# Patient Record
Sex: Male | Born: 1955 | Race: White | Hispanic: No | Marital: Married | Smoking: Never smoker
Health system: Southern US, Community
[De-identification: ages and names within clinical notes are randomized; demographics above are authoritative.]

## PROBLEM LIST (undated history)

## (undated) DIAGNOSIS — K219 Gastro-esophageal reflux disease without esophagitis: Secondary | ICD-10-CM

## (undated) DIAGNOSIS — K56609 Unspecified intestinal obstruction, unspecified as to partial versus complete obstruction: Secondary | ICD-10-CM

## (undated) DIAGNOSIS — I10 Essential (primary) hypertension: Secondary | ICD-10-CM

## (undated) DIAGNOSIS — E785 Hyperlipidemia, unspecified: Secondary | ICD-10-CM

## (undated) HISTORY — PX: CATARACT EXTRACTION: SUR2

## (undated) HISTORY — DX: Hyperlipidemia, unspecified: E78.5

---

## 2005-11-09 ENCOUNTER — Ambulatory Visit (HOSPITAL_COMMUNITY): Admission: RE | Admit: 2005-11-09 | Discharge: 2005-11-09 | Payer: Self-pay | Admitting: Internal Medicine

## 2008-02-20 ENCOUNTER — Ambulatory Visit (HOSPITAL_COMMUNITY): Admission: RE | Admit: 2008-02-20 | Discharge: 2008-02-20 | Payer: Self-pay | Admitting: Pulmonary Disease

## 2009-09-25 ENCOUNTER — Inpatient Hospital Stay (HOSPITAL_COMMUNITY): Admission: EM | Admit: 2009-09-25 | Discharge: 2009-09-29 | Payer: Self-pay | Admitting: Emergency Medicine

## 2009-10-02 ENCOUNTER — Ambulatory Visit: Payer: Self-pay | Admitting: Gastroenterology

## 2009-10-02 DIAGNOSIS — R109 Unspecified abdominal pain: Secondary | ICD-10-CM | POA: Insufficient documentation

## 2009-10-02 DIAGNOSIS — R1084 Generalized abdominal pain: Secondary | ICD-10-CM | POA: Insufficient documentation

## 2009-10-04 ENCOUNTER — Ambulatory Visit (HOSPITAL_COMMUNITY): Admission: RE | Admit: 2009-10-04 | Discharge: 2009-10-04 | Payer: Self-pay | Admitting: Gastroenterology

## 2009-11-27 ENCOUNTER — Encounter (INDEPENDENT_AMBULATORY_CARE_PROVIDER_SITE_OTHER): Payer: Self-pay | Admitting: *Deleted

## 2010-02-25 NOTE — Letter (Signed)
Summary: UGI WITH SBFT ORDER  UGI WITH SBFT ORDER   Imported By: Ave Filter 10/02/2009 14:39:18  _____________________________________________________________________  External Attachment:    Type:   Image     Comment:   External Document

## 2010-02-25 NOTE — Assessment & Plan Note (Signed)
Summary: ABD PAIN 2O TO pSBO   Visit Type:  Initial Consult Referring Vicenta Olds:  [facility] Orton Capell:  Ouida Sills, M.D.  Chief Complaint:  abd pain/bloating.  History of Present Illness: BCs powders: for HAs once a week for  ~40 years. Still taking them. Sx for a couple of years with chest hurting and bloating. Eats something slow about going down. Food builds up in stomach.  WED hurting bad, NV and taken to ED. Hospitalized in SEP 2011. Saw Dr. Malvin Johns who said "intestines were twisted and might take card of itself." Now watching what he he's eating. BMs: 1-2 times a day, nl BM. Can be loose. No blood in stool. Soon as drinks a drink feels stopped up. Sx lasts for about 3 hours. Pain begins w/i in a hour. No problems swallowing.   No weight loss. Appetite: if could eat more he would. Reflux helps GERD. Can have breakthrough Sx-no cigs.  Taking Motrin rare for Has.  Preventive Screening-Counseling & Management  Alcohol-Tobacco     Smoking Status: never  Current Medications (verified): 1)  Toprol Xl 100 Mg Xr24h-Tab (Metoprolol Succinate) .... Take 1 Tablet By Mouth Once A Day 2)  Amlodipine Besylate 10 Mg Tabs (Amlodipine Besylate) .... Take 1 Tablet By Mouth Once A Day 3)  Protonix 40 Mg Tbec (Pantoprazole Sodium) .... Take 1 Tablet By Mouth Once A Day 4)  Bc Powders .... As Needed 5)  Motrin Ib 200 Mg Tabs (Ibuprofen) .... 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  Alcohol Use - no Married: x 10 yrs-4 own, 3 step Smoking Status:  never  Review of Systems       No melena for couple years.  PER HPI OTHERWISE ALL SYSTEMS NEGATIVE.  Vital Signs:  Patient  profile:   55 year old male Height:      65 inches Weight:      257 pounds BMI:     42.92 Temp:     98.5 degrees F oral Pulse rate:   56 / minute BP sitting:   150 / 92  (left arm) Cuff size:   large  Vitals Entered By: Cloria Spring LPN (October 02, 2009 1:49 PM)  Physical Exam  General:  Well developed, well nourished, no acute distress. Head:  Normocephalic and atraumatic. Eyes:  PERRL, no icterus. Mouth:  No deformity or lesions. Neck:  Supple; no masses. Lungs:  Clear throughout to auscultation. Heart:  Regular rate and rhythm; no murmurs. Abdomen:  Soft, MILD TTP LUQ and MOD TTP IN LLQ W/ITH MILD REBOUND AND NO GUARDING, mildly distended. No masses noted. Normal bowel sounds. obese Extremities:  No edema noted. Neurologic:  Alert and  oriented x4;  grossly normal neurologically.  Impression & Recommendations:  Problem # 1:  ABDOMINAL PAIN OTHER SPECIFIED SITE (ICD-789.09) Assessment New Likley 2o to adhesion in small bowell or NSAID ENTEROPATHY (STRICTURE). Pt has Sx daily. UGI/SBFT then refer to Dr. Malvin Johns. OPV in 6 mos and will discuss TCS. Low residue diet HO given.  CC: PCP  Appended Document: ABD PAIN 2O TO pSBO 6 MONTH F/U OPV IS IN THE COMPUTER  Appended Document: ABD PAIN 2O TO pSBO 6 MONTH F/U OPV IS IN THE  COMPUTER  Appended Document: Orders Update    Clinical Lists Changes  Orders: Added new Service order of Consultation Level IV [phone]) - Signed

## 2010-02-25 NOTE — Letter (Signed)
Summary: Recall Office Visit  [facility]  [address]   [place], [place] [address]   Phone: [phone]  Fax: [phone]      November 27, 2009   DARREL GLOSS [address] [place], [place]  [address] 1955-06-13   Dear Mr. DULUDE,   According to our records, it is time for you to schedule a follow-up office visit with [place].   At your convenience, please call [phone] to schedule an office visit. If you have any questions, concerns, or feel that this letter is in error, we would appreciate your call.   Sincerely,    Diana Eves  [facility] Ph: [phone]   Fax: [phone]

## 2010-04-10 LAB — DIFFERENTIAL
Basophils Absolute: 0 10*3/uL (ref 0.0–0.1)
Basophils Absolute: 0 10*3/uL (ref 0.0–0.1)
Basophils Relative: 1 % (ref 0–1)
Eosinophils Relative: 3 % (ref 0–5)
Lymphocytes Relative: 19 % (ref 12–46)
Lymphocytes Relative: 25 % (ref 12–46)
Lymphs Abs: 1.5 10*3/uL (ref 0.7–4.0)
Monocytes Absolute: 0.9 10*3/uL (ref 0.1–1.0)
Monocytes Absolute: 1.2 10*3/uL — ABNORMAL HIGH (ref 0.1–1.0)
Monocytes Absolute: 1.5 10*3/uL — ABNORMAL HIGH (ref 0.1–1.0)
Neutro Abs: 3 10*3/uL (ref 1.7–7.7)
Neutro Abs: 3.3 10*3/uL (ref 1.7–7.7)
Neutro Abs: 5.2 10*3/uL (ref 1.7–7.7)
Neutrophils Relative %: 51 % (ref 43–77)
Neutrophils Relative %: 54 % (ref 43–77)

## 2010-04-10 LAB — URINE CULTURE
Colony Count: NO GROWTH
Culture: NO GROWTH

## 2010-04-10 LAB — CBC
HCT: 43.1 % (ref 39.0–52.0)
HCT: 44.7 % (ref 39.0–52.0)
Hemoglobin: 14.9 g/dL (ref 13.0–17.0)
MCHC: 34.6 g/dL (ref 30.0–36.0)
MCV: 91.1 fL (ref 78.0–100.0)
Platelets: 187 10*3/uL (ref 150–400)
RBC: 4.73 MIL/uL (ref 4.22–5.81)
RDW: 12.3 % (ref 11.5–15.5)
RDW: 12.8 % (ref 11.5–15.5)
WBC: 6.1 10*3/uL (ref 4.0–10.5)
WBC: 8 10*3/uL (ref 4.0–10.5)

## 2010-04-10 LAB — BASIC METABOLIC PANEL
BUN: 12 mg/dL (ref 6–23)
BUN: 8 mg/dL (ref 6–23)
Calcium: 8.6 mg/dL (ref 8.4–10.5)
Calcium: 9 mg/dL (ref 8.4–10.5)
Chloride: 102 mEq/L (ref 96–112)
GFR calc Af Amer: >60 mL/min (ref >60)
GFR calc non Af Amer: >60 mL/min (ref >60)
GFR calc non Af Amer: >60 mL/min (ref >60)
GFR calc non Af Amer: >60 mL/min (ref >60)
Glucose, Bld: 110 mg/dL — ABNORMAL HIGH (ref 70–99)
Potassium: 3.3 mEq/L — ABNORMAL LOW (ref 3.5–5.1)
Potassium: 3.3 mEq/L — ABNORMAL LOW (ref 3.5–5.1)
Sodium: 136 mEq/L (ref 135–145)

## 2010-04-11 LAB — COMPREHENSIVE METABOLIC PANEL
ALT: 49 U/L (ref 0–53)
AST: 49 U/L — ABNORMAL HIGH (ref 0–37)
Alkaline Phosphatase: 102 U/L (ref 39–117)
CO2: 26 mEq/L (ref 19–32)
Chloride: 104 mEq/L (ref 96–112)
Creatinine, Ser: 1.19 mg/dL (ref 0.4–1.5)
GFR calc Af Amer: >60 mL/min (ref >60)
GFR calc non Af Amer: >60 mL/min (ref >60)
Sodium: 139 mEq/L (ref 135–145)
Total Bilirubin: 1 mg/dL (ref 0.3–1.2)

## 2010-04-11 LAB — DIFFERENTIAL
Basophils Absolute: 0 10*3/uL (ref 0.0–0.1)
Basophils Relative: 0 % (ref 0–1)
Eosinophils Absolute: 0 10*3/uL (ref 0.0–0.7)
Eosinophils Relative: 0 % (ref 0–5)

## 2010-04-11 LAB — URINALYSIS, ROUTINE W REFLEX MICROSCOPIC
Bilirubin Urine: NEGATIVE
Glucose, UA: NEGATIVE mg/dL
Ketones, ur: NEGATIVE mg/dL
Leukocytes, UA: NEGATIVE
Nitrite: NEGATIVE
Specific Gravity, Urine: >1.03 — ABNORMAL HIGH (ref 1.005–1.030)
pH: 6 (ref 5.0–8.0)

## 2010-04-11 LAB — CBC
Hemoglobin: 16.8 g/dL (ref 13.0–17.0)
MCH: 31.3 pg (ref 26.0–34.0)
RBC: 5.35 MIL/uL (ref 4.22–5.81)

## 2010-04-11 LAB — URINE MICROSCOPIC-ADD ON

## 2010-04-11 LAB — LIPASE, BLOOD: Lipase: 28 U/L (ref 11–59)

## 2011-06-06 ENCOUNTER — Encounter (HOSPITAL_COMMUNITY): Payer: Self-pay

## 2011-06-06 ENCOUNTER — Inpatient Hospital Stay (HOSPITAL_COMMUNITY)
Admission: EM | Admit: 2011-06-06 | Discharge: 2011-06-09 | DRG: 388 | Disposition: A | Discharge status: Home / Self Care | Payer: 59 | Attending: Internal Medicine | Admitting: Internal Medicine

## 2011-06-06 ENCOUNTER — Emergency Department (HOSPITAL_COMMUNITY): Payer: 59

## 2011-06-06 DIAGNOSIS — E119 Type 2 diabetes mellitus without complications: Secondary | ICD-10-CM | POA: Diagnosis present

## 2011-06-06 DIAGNOSIS — K565 Intestinal adhesions [bands], unspecified as to partial versus complete obstruction: Secondary | ICD-10-CM

## 2011-06-06 DIAGNOSIS — E1165 Type 2 diabetes mellitus with hyperglycemia: Secondary | ICD-10-CM

## 2011-06-06 DIAGNOSIS — E860 Dehydration: Secondary | ICD-10-CM | POA: Diagnosis present

## 2011-06-06 DIAGNOSIS — K56609 Unspecified intestinal obstruction, unspecified as to partial versus complete obstruction: Principal | ICD-10-CM | POA: Diagnosis present

## 2011-06-06 DIAGNOSIS — E669 Obesity, unspecified: Secondary | ICD-10-CM | POA: Diagnosis present

## 2011-06-06 DIAGNOSIS — I10 Essential (primary) hypertension: Secondary | ICD-10-CM | POA: Diagnosis present

## 2011-06-06 DIAGNOSIS — J690 Pneumonitis due to inhalation of food and vomit: Secondary | ICD-10-CM | POA: Diagnosis present

## 2011-06-06 DIAGNOSIS — N179 Acute kidney failure, unspecified: Secondary | ICD-10-CM | POA: Diagnosis present

## 2011-06-06 DIAGNOSIS — D696 Thrombocytopenia, unspecified: Secondary | ICD-10-CM

## 2011-06-06 HISTORY — DX: Unspecified intestinal obstruction, unspecified as to partial versus complete obstruction: K56.609

## 2011-06-06 HISTORY — DX: Gastro-esophageal reflux disease without esophagitis: K21.9

## 2011-06-06 HISTORY — DX: Essential (primary) hypertension: I10

## 2011-06-06 LAB — CBC
HCT: 51 % (ref 39.0–52.0)
Hemoglobin: 18.2 g/dL — ABNORMAL HIGH (ref 13.0–17.0)
MCH: 31.8 pg (ref 26.0–34.0)
MCHC: 35.7 g/dL (ref 30.0–36.0)

## 2011-06-06 LAB — COMPREHENSIVE METABOLIC PANEL
ALT: 39 U/L (ref 0–53)
AST: 32 U/L (ref 0–37)
Albumin: 4.2 g/dL (ref 3.5–5.2)
CO2: 28 mEq/L (ref 19–32)
Calcium: 10.3 mg/dL (ref 8.4–10.5)
GFR calc non Af Amer: 40 mL/min — ABNORMAL LOW (ref >90)
Sodium: 135 mEq/L (ref 135–145)

## 2011-06-06 LAB — DIFFERENTIAL
Band Neutrophils: 0 % (ref 0–10)
Basophils Absolute: 0 10*3/uL (ref 0.0–0.1)
Basophils Relative: 0 % (ref 0–1)
Eosinophils Absolute: 0 10*3/uL (ref 0.0–0.7)
Eosinophils Relative: 0 % (ref 0–5)
Lymphocytes Relative: 24 % (ref 12–46)
Lymphs Abs: 3 10*3/uL (ref 0.7–4.0)
Myelocytes: 0 %
Promyelocytes Absolute: 0 %

## 2011-06-06 MED ORDER — ONDANSETRON HCL 4 MG/2ML IJ SOLN
4.0000 mg | Freq: Once | INTRAMUSCULAR | Status: AC
  Administered 2011-06-06: 4 mg via INTRAVENOUS
  Filled 2011-06-06: qty 2

## 2011-06-06 MED ORDER — SODIUM CHLORIDE 0.9 % IV BOLUS (SEPSIS)
1000.0000 mL | Freq: Once | INTRAVENOUS | Status: AC
  Administered 2011-06-06: 1000 mL via INTRAVENOUS

## 2011-06-06 MED ORDER — IOHEXOL 300 MG/ML  SOLN
100.0000 mL | Freq: Once | INTRAMUSCULAR | Status: AC | PRN
  Administered 2011-06-06: 100 mL via INTRAVENOUS

## 2011-06-06 MED ORDER — ONDANSETRON HCL 4 MG/2ML IJ SOLN: 4.0000 mg | Freq: Once | INTRAMUSCULAR | Status: DC

## 2011-06-06 MED ORDER — HYDROMORPHONE HCL PF 1 MG/ML IJ SOLN
1.0000 mg | Freq: Once | INTRAMUSCULAR | Status: AC
  Administered 2011-06-06: 1 mg via INTRAVENOUS
  Filled 2011-06-06: qty 1

## 2011-06-06 NOTE — H&P (Signed)
Benjamin Moreno is an 56 y.o. male.    PCP: Carylon Perches, MD, MD   Chief Complaint: Abdominal pain, nausea, vomiting, and diarrhea  HPI: This is a 56 year old, Caucasian male, with a past medical history of hypertension, and diabetes, who was in his usual state of health till Thursday, when he started having nausea, vomiting, and diarrhea. He tells me that he has vomited about 25 times. Initially, it was food and then watery. Denies any blood in the emesis. He's been having several episodes of loose stool. Denies any recent antibiotic use. His abdominal pain is located in the lower abdomen. It's a pressure-like sensation. 10 out of 10 in intensity. Denies any radiation. Denies any precipitating, aggravating or relieving factors. He said he felt quite hot and thought he might have had a fever yesterday, but did not check his temperature. Denies any sick contacts. He had similar episode about 2 years ago.   Home Medications: Prior to Admission medications   Medication Sig Start Date End Date Taking? Authorizing Provider  amLODipine (NORVASC) 10 MG tablet Take 10 mg by mouth daily.   Yes Historical Provider, MD  glimepiride (AMARYL) 1 MG tablet Take 1 mg by mouth daily.   Yes Historical Provider, MD  metFORMIN (GLUCOPHAGE) 500 MG tablet Take 500 mg by mouth 2 (two) times daily.   Yes Historical Provider, MD  metoprolol succinate (TOPROL-XL) 100 MG 24 hr tablet Take 100 mg by mouth daily. Take with or immediately following a meal.   Yes Historical Provider, MD  pantoprazole (PROTONIX) 40 MG tablet Take 40 mg by mouth daily.   Yes Historical Provider, MD    Allergies: No Known Allergies  Past Medical History: Past Medical History  Diagnosis Date  . Bowel obstruction   . Hypertension   . Diabetes mellitus     Past Surgical History  Procedure Date  . Cataract extraction     Social History:  reports that he has never smoked. He does not have any smokeless tobacco history on file. He reports  that he does not drink alcohol or use illicit drugs.  Family History:  Family History  Problem Relation Age of Onset  . Cancer Mother   . Heart disease Father     Review of Systems - History obtained from the patient General ROS: positive for  - fatigue Psychological ROS: negative Ophthalmic ROS: negative ENT ROS: negative Allergy and Immunology ROS: negative Hematological and Lymphatic ROS: negative Endocrine ROS: negative Respiratory ROS: no cough, shortness of breath, or wheezing Cardiovascular ROS: no chest pain or dyspnea on exertion Gastrointestinal ROS: as in hpi Genito-Urinary ROS: negative Musculoskeletal ROS: negative Neurological ROS: no TIA or stroke symptoms Dermatological ROS: negative  Physical Examination Blood pressure 132/79, pulse 88, temperature 98.5 F (36.9 C), temperature source Oral, resp. rate 18, height 5\' 4"  (1.626 m), weight 108.863 kg (240 lb), SpO2 98.00%.  General appearance: alert, cooperative, appears stated age, no distress and moderately obese Head: Normocephalic, without obvious abnormality, atraumatic Eyes: conjunctivae/corneas clear. PERRL, EOM's intact.  Throat: lips, mucosa, and tongue normal; teeth and gums normal Neck: no adenopathy, no carotid bruit, no JVD, supple, symmetrical, trachea midline and thyroid not enlarged, symmetric, no tenderness/mass/nodules Back: symmetric, no curvature. ROM normal. No CVA tenderness. Resp: clear to auscultation bilaterally Cardio: regular rate and rhythm, S1, S2 normal, no murmur, click, rub or gallop GI: Obese. Tender in the lower abdomen, but more so on the left lower quadrant. No rebound, rigidity, or guarding. No masses,  or organomegaly speciated. Also present. Extremities: extremities normal, atraumatic, no cyanosis or edema Pulses: 2+ and symmetric Skin: Skin color, texture, turgor normal. No rashes or lesions Lymph nodes: Cervical, supraclavicular, and axillary nodes normal. Neurologic:  Alert and oriented X 3, normal strength and tone. Normal symmetric reflexes. Normal coordination and gait  Laboratory Data: Results for orders placed during the hospital encounter of 06/06/11 (from the past 48 hour(s))  CBC     Status: Abnormal   Collection Time   06/06/11  9:03 PM      Component Value Range Comment   WBC 12.5 (*) 4.0 - 10.5 (K/uL)    RBC 5.73  4.22 - 5.81 (MIL/uL)    Hemoglobin 18.2 (*) 13.0 - 17.0 (g/dL)    HCT 16.1  09.6 - 04.5 (%)    MCV 89.0  78.0 - 100.0 (fL)    MCH 31.8  26.0 - 34.0 (pg)    MCHC 35.7  30.0 - 36.0 (g/dL)    RDW 40.9  81.1 - 91.4 (%)    Platelets 246  150 - 400 (K/uL)   DIFFERENTIAL     Status: Abnormal   Collection Time   06/06/11  9:03 PM      Component Value Range Comment   Neutrophils Relative 72  43 - 77 (%)    Lymphocytes Relative 24  12 - 46 (%)    Monocytes Relative 4  3 - 12 (%)    Eosinophils Relative 0  0 - 5 (%)    Basophils Relative 0  0 - 1 (%)    Band Neutrophils 0  0 - 10 (%)    Metamyelocytes Relative 0      Myelocytes 0      Promyelocytes Absolute 0      Blasts 0      nRBC 0  0 (/100 WBC)    Neutro Abs 9.0 (*) 1.7 - 7.7 (K/uL)    Lymphs Abs 3.0  0.7 - 4.0 (K/uL)    Monocytes Absolute 0.5  0.1 - 1.0 (K/uL)    Eosinophils Absolute 0.0  0.0 - 0.7 (K/uL)    Basophils Absolute 0.0  0.0 - 0.1 (K/uL)   COMPREHENSIVE METABOLIC PANEL     Status: Abnormal   Collection Time   06/06/11  9:03 PM      Component Value Range Comment   Sodium 135  135 - 145 (mEq/L)    Potassium 4.0  3.5 - 5.1 (mEq/L)    Chloride 95 (*) 96 - 112 (mEq/L)    CO2 28  19 - 32 (mEq/L)    Glucose, Bld 116 (*) 70 - 99 (mg/dL)    BUN 37 (*) 6 - 23 (mg/dL)    Creatinine, Ser 7.82 (*) 0.50 - 1.35 (mg/dL)    Calcium 95.6  8.4 - 10.5 (mg/dL)    Total Protein 8.3  6.0 - 8.3 (g/dL)    Albumin 4.2  3.5 - 5.2 (g/dL)    AST 32  0 - 37 (U/L)    ALT 39  0 - 53 (U/L)    Alkaline Phosphatase 78  39 - 117 (U/L)    Total Bilirubin 0.9  0.3 - 1.2 (mg/dL)    GFR calc  non Af Amer 40 (*) >90 (mL/min)    GFR calc Af Amer 47 (*) >90 (mL/min)   LIPASE, BLOOD     Status: Normal   Collection Time   06/06/11  9:03 PM      Component Value Range  Comment   Lipase 36  11 - 59 (U/L)     Radiology Reports: Ct Abdomen Pelvis W Contrast  06/06/2011  *RADIOLOGY REPORT*  Clinical Data: Abdominal pain, nausea and vomiting.  CT ABDOMEN AND PELVIS WITH CONTRAST  Technique:  Multidetector CT imaging of the abdomen and pelvis was performed following the standard protocol during bolus administration of intravenous contrast.  Contrast: OMNIPAQUE IOHEXOL 300 MG/ML  SOLN  Comparison: Plain films of the chest and abdomen earlier this same date.  CT abdomen and pelvis 09/27/2009 and 09/25/2009.  Findings: There is patchy airspace disease in the right middle lobe with some air bronchograms present.  Punctate sub centimeter pleural nodule in the right lower lobe on image 5 is unchanged. No pleural or pericardial effusion.  The lung bases are otherwise unremarkable.  As seen on plain films, the patient has a small bowel obstruction. Small bowel loops are dilated up to 4.4 cm.  There is no pneumatosis, free intraperitoneal air or portal venous gas.  No mass is identified.  Transition point appears to lie in the mid abdomen where an angulated loop is identified on image 55.  Distal loops are decompressed.  The colon is unremarkable.  The patient is status post appendectomy.  The gallbladder, liver, spleen, adrenal glands and pancreas are unremarkable.  There is some scarring in the left kidney.  Punctate nonobstructing stone is noted in the upper pole of the right kidney.  Small fat containing umbilical hernia is identified. There is no focal bony abnormality.  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  IMPRESSION:  1.  Study is positive for small bowel obstruction which appears to be due to adhesions. 2.  Right middle lobe airspace disease is worrisome for pneumonia. 3.   Punctate nonobstructing stone upper pole right kidney.  Original Report Authenticated By: Bernadene Bell. D'ALESSIO, M.D.   Dg Abd Acute W/chest  06/06/2011  *RADIOLOGY REPORT*  Clinical Data: Abdominal pain and distention.  Nausea and vomiting.  ACUTE ABDOMEN SERIES (ABDOMEN 2 VIEW & CHEST 1 VIEW)  Comparison: Chest and two views abdomen 09/28/2009.  Findings: Single view of the chest demonstrates clear lungs and normal heart size.  No pneumothorax or pleural fluid.  Two views of the abdomen show no free intraperitoneal air. Multiple dilated loops of small bowel measuring up to 5.7 cm are seen. Small bowel air-fluid levels are noted.  There is some gas in the colon.  IMPRESSION: Findings compatible with small bowel obstruction.  Original Report Authenticated By: Bernadene Bell. Maricela Curet, M.D.     Assessment/Plan  Principal Problem:  *SBO (small bowel obstruction) Active Problems:  HTN (hypertension)  DM type 2 (diabetes mellitus, type 2)  Obesity  ARF (acute renal failure)  Aspiration pneumonia   #1 small bowel obstruction thought to be secondary to adhesions: He reports no abdominal surgeries in the past. NG tube will be placed. Patient will be kept n.p.o. Acute abdominal series will be repeated in the morning. General surgical consultation will be obtained.  #2 pneumonia: This is most likely due to aspiration. We will cover him with Levaquin for now and monitor him closely.  #3. Mild acute renal failure: Likely due to hypovolemia. We will give him IV fluids, and recheck his renal function.  #4 diabetes, type II: We will put him on a sliding scale insulin.  #5 history of hypertension: Monitor blood pressures closely.  DVT, prophylaxis will be initiated.  Further management decisions will depend on results  of further testing and patient's response to treatment.  Dr. Ouida Sills will assume care in the morning.  [facility]  [facility] Pager [phone]  06/06/2011, 11:32 PM

## 2011-06-06 NOTE — ED Notes (Signed)
Pt presents with abdominal pain, diarrhea, and vomiting since Thursday. Pt states he has a bowel obstruction.

## 2011-06-06 NOTE — ED Provider Notes (Signed)
History  This chart was scribed for Donnetta Hutching, MD by Bennett Scrape. This patient was seen in room APA06/APA06 and the patient's care was started at 8:24PM.  CSN: [id number]  Arrival date & time 06/06/11  1951   First MD Initiated Contact with Patient 06/06/11 2024      Chief Complaint  Patient presents with  . Diarrhea  . Emesis    The history is provided by the patient. No language interpreter was used.     Benjamin Moreno is a 56 y.o. male who presents to the Emergency Department complaining of 2 days of diffuse abdominal pain with associated diarrhea described as just water and emesis. Pt reports that laying flat improves his pain. He also c/o decreased urine output and sternal chest pain that he attributes to the several episodes of emesis. He has not taken any medications to improve his pain. He reports that he had a bowel obstruction 2 years ago and states that the pain is similar to the pain experienced then. He denies having abdominal surgery to correct the obstruction. He denies any other symptoms such as fever, rash, back pain, dysuria and HA. He has also has a h/o HTN and diabetes. He denies smoking and alcohol use.  PCP is Dr. Ouida Sills.   Past Medical History  Diagnosis Date  . Bowel obstruction   . Hypertension   . Diabetes mellitus     History reviewed. No pertinent past surgical history.  No family history on file.  History  Substance Use Topics  . Smoking status: Never Smoker   . Smokeless tobacco: Not on file  . Alcohol Use: No      Review of Systems  A complete 10 system review of systems was obtained and all systems are negative except as noted in the HPI and PMH.   Allergies  Review of patient's allergies indicates no known allergies.  Home Medications  No current outpatient prescriptions on file.  Triage Vitals: BP 132/79  Pulse 88  Temp(Src) 98.5 F (36.9 C) (Oral)  Resp 18  Ht 5\' 4"  (1.626 m)  Wt 240 lb (108.863 kg)  BMI 41.20 kg/m2   SpO2 98%  Physical Exam  Nursing note and vitals reviewed. Constitutional: He is oriented to person, place, and time. He appears well-developed and well-nourished.  HENT:  Head: Normocephalic and atraumatic.  Eyes: Conjunctivae and EOM are normal. Pupils are equal, round, and reactive to light.  Neck: Normal range of motion. Neck supple.  Cardiovascular: Normal rate and regular rhythm.   Pulmonary/Chest: Effort normal and breath sounds normal.  Abdominal: Soft. Bowel sounds are normal. There is no tenderness.  Musculoskeletal: Normal range of motion.  Neurological: He is alert and oriented to person, place, and time.  Skin: Skin is warm and dry.  Psychiatric: He has a normal mood and affect. His behavior is normal.    ED Course  Procedures (including critical care time)  DIAGNOSTIC STUDIES: Oxygen Saturation is 98% on room air, normal by my interpretation.    COORDINATION OF CARE: 8:54PM-Discussed IV fluids, abdominal x-ray, pain medications and blood work with pt and pt agreed to plan.   Labs Reviewed  CBC - Abnormal; Notable for the following:    WBC 12.5 (*)    Hemoglobin 18.2 (*)    All other components within normal limits  DIFFERENTIAL - Abnormal; Notable for the following:    Neutro Abs 9.0 (*)    All other components within normal limits  COMPREHENSIVE METABOLIC PANEL -  Abnormal; Notable for the following:    Chloride 95 (*)    Glucose, Bld 116 (*)    BUN 37 (*)    Creatinine, Ser 1.82 (*)    GFR calc non Af Amer 40 (*)    GFR calc Af Amer 47 (*)    All other components within normal limits  LIPASE, BLOOD  URINALYSIS, ROUTINE W REFLEX MICROSCOPIC  Dg Abd Acute W/chest  06/06/2011  *RADIOLOGY REPORT*  Clinical Data: Abdominal pain and distention.  Nausea and vomiting.  ACUTE ABDOMEN SERIES (ABDOMEN 2 VIEW & CHEST 1 VIEW)  Comparison: Chest and two views abdomen 09/28/2009.  Findings: Single view of the chest demonstrates clear lungs and normal heart size.  No  pneumothorax or pleural fluid.  Two views of the abdomen show no free intraperitoneal air. Multiple dilated loops of small bowel measuring up to 5.7 cm are seen. Small bowel air-fluid levels are noted.  There is some gas in the colon.  IMPRESSION: Findings compatible with small bowel obstruction.  Original Report Authenticated By: Bernadene Bell. Maricela Curet, M.D.   No results found.   No diagnosis found.    MDM  Three-way abdomen shows small bowel obstruction. Will hydrate patient, treat pain. CT scan pending.  Discussed with hospitalist. Admit.  Patient is hemodynamically stable      I personally performed the services described in this documentation, which was scribed in my presence. The recorded information has been reviewed and considered.    Donnetta Hutching, MD 06/06/11 [phone]

## 2011-06-06 NOTE — ED Notes (Signed)
Patient states he has had "watery bowel movements" x 2 days. States "the doctor told me about 2 years ago that I had a bowel obstruction but they didn't do anything about it."

## 2011-06-07 ENCOUNTER — Inpatient Hospital Stay (HOSPITAL_COMMUNITY): Payer: 59

## 2011-06-07 ENCOUNTER — Encounter (HOSPITAL_COMMUNITY): Payer: Self-pay

## 2011-06-07 LAB — URINE MICROSCOPIC-ADD ON

## 2011-06-07 LAB — URINALYSIS, ROUTINE W REFLEX MICROSCOPIC
Glucose, UA: NEGATIVE mg/dL
Leukocytes, UA: NEGATIVE
Protein, ur: 30 mg/dL — AB
Specific Gravity, Urine: 1.015 (ref 1.005–1.030)
pH: 5.5 (ref 5.0–8.0)

## 2011-06-07 LAB — CBC
Hemoglobin: 16.3 g/dL (ref 13.0–17.0)
RBC: 5.2 MIL/uL (ref 4.22–5.81)
WBC: 8.8 10*3/uL (ref 4.0–10.5)

## 2011-06-07 LAB — GLUCOSE, CAPILLARY
Glucose-Capillary: 88 mg/dL (ref 70–99)
Glucose-Capillary: 98 mg/dL (ref 70–99)

## 2011-06-07 LAB — COMPREHENSIVE METABOLIC PANEL
CO2: 25 mEq/L (ref 19–32)
Calcium: 9 mg/dL (ref 8.4–10.5)
Creatinine, Ser: 1.27 mg/dL (ref 0.50–1.35)
GFR calc Af Amer: 72 mL/min — ABNORMAL LOW (ref >90)
GFR calc non Af Amer: 62 mL/min — ABNORMAL LOW (ref >90)
Glucose, Bld: 97 mg/dL (ref 70–99)

## 2011-06-07 LAB — HEMOGLOBIN A1C
Hgb A1c MFr Bld: 5.8 % — ABNORMAL HIGH (ref <5.7)
Mean Plasma Glucose: 120 mg/dL — ABNORMAL HIGH (ref <117)

## 2011-06-07 MED ORDER — ONDANSETRON HCL 4 MG PO TABS: 4.0000 mg | ORAL_TABLET | Freq: Four times a day (QID) | ORAL | Status: DC | PRN

## 2011-06-07 MED ORDER — ACETAMINOPHEN 325 MG PO TABS: 650.0000 mg | ORAL_TABLET | Freq: Four times a day (QID) | ORAL | Status: DC | PRN

## 2011-06-07 MED ORDER — ACETAMINOPHEN 650 MG RE SUPP: 650.0000 mg | Freq: Four times a day (QID) | RECTAL | Status: DC | PRN

## 2011-06-07 MED ORDER — ONDANSETRON HCL 4 MG/2ML IJ SOLN: 4.0000 mg | Freq: Four times a day (QID) | INTRAMUSCULAR | Status: DC | PRN

## 2011-06-07 MED ORDER — HYDROMORPHONE HCL PF 1 MG/ML IJ SOLN
1.0000 mg | INTRAMUSCULAR | Status: DC | PRN
  Administered 2011-06-07 – 2011-06-08 (×3): 1 mg via INTRAVENOUS
  Filled 2011-06-07 (×3): qty 1

## 2011-06-07 MED ORDER — INSULIN ASPART 100 UNIT/ML ~~LOC~~ SOLN
0.0000 [IU] | SUBCUTANEOUS | Status: DC
  Administered 2011-06-08: 2 [IU] via SUBCUTANEOUS

## 2011-06-07 MED ORDER — LEVOFLOXACIN IN D5W 500 MG/100ML IV SOLN
500.0000 mg | INTRAVENOUS | Status: DC
  Administered 2011-06-07 – 2011-06-09 (×3): 500 mg via INTRAVENOUS
  Filled 2011-06-07 (×3): qty 100

## 2011-06-07 MED ORDER — LEVOFLOXACIN IN D5W 500 MG/100ML IV SOLN
INTRAVENOUS | Status: AC
  Filled 2011-06-07: qty 100

## 2011-06-07 MED ORDER — PANTOPRAZOLE SODIUM 40 MG IV SOLR
40.0000 mg | INTRAVENOUS | Status: DC
  Administered 2011-06-07 – 2011-06-09 (×3): 40 mg via INTRAVENOUS
  Filled 2011-06-07 (×3): qty 40

## 2011-06-07 MED ORDER — BIOTENE DRY MOUTH MT LIQD
15.0000 mL | Freq: Two times a day (BID) | OROMUCOSAL | Status: DC
  Administered 2011-06-07 – 2011-06-09 (×4): 15 mL via OROMUCOSAL

## 2011-06-07 MED ORDER — DEXTROSE-NACL 5-0.45 % IV SOLN
INTRAVENOUS | Status: DC
  Administered 2011-06-07 – 2011-06-09 (×5): via INTRAVENOUS

## 2011-06-07 MED ORDER — ALBUTEROL SULFATE (5 MG/ML) 0.5% IN NEBU: 2.5000 mg | INHALATION_SOLUTION | RESPIRATORY_TRACT | Status: DC | PRN

## 2011-06-07 NOTE — Progress Notes (Signed)
06/07/11 1846 Patient's wife expressed concerns regarding when surgeon to see patient, so she would be present. Spoke with Dr Leticia Penna this evening, aware of consult, stated would see patient in the morning. Given update on patient condition, faint bowel sounds, denies abd pain or nausea, no vomiting. Stated okay for patient to have some clears if flatus present or having bowel movements. Will notify night shift nurse. Notified patient and wife he will see patient in am. Stated okay.

## 2011-06-07 NOTE — ED Notes (Signed)
Attempted to put in gastric tube unsuccessfully x 2. Patient coughing and gagging. Patient unable to tolerate. Advised patient we would let him rest and retry with a smaller catheter.

## 2011-06-07 NOTE — Progress Notes (Signed)
Refusing to try again  for NGT placement. MD notified. States he hasn't thrown up since yesterday. Discussed benefits with patient. States if he started to vomit again he would let me attempt placement.

## 2011-06-07 NOTE — Progress Notes (Signed)
Benjamin Moreno, Benjamin Moreno                 ACCOUNT NO.:  [id number]  MEDICAL RECORD NO.:  [id number]  LOCATION:  A305                          FACILITY:  [facility]  PHYSICIAN:  Raelynne Ludwick G. Renard Matter, MD   DATE OF BIRTH:  July 30, 1955  DATE OF PROCEDURE: DATE OF DISCHARGE:                                PROGRESS NOTE   This patient is feeling some better today.  He was admitted last evening with history of multiple episodes of vomiting and lower abdominal pain. A CT of the abdomen was completed and was positive for small bowel obstruction, possibly due to adhesions and questionable right middle lobe pneumonia.  The patient does have a prior history of hypertension and non-insulin dependent diabetes.  Attempts were made to put in an NG tube, but he did not tolerate this.  OBJECTIVE:  VITAL SIGNS:  Blood pressure 113/64, respirations 20, pulse 58, and temp 98.2. LUNGS:  Clear to P and A. HEART:  Regular rhythm. ABDOMEN:  No palpable organs or masses.  Minimal tenderness.  ASSESSMENT:  The patient was admitted with small bowel obstruction and abdominal pain.  He does have evidence of early pneumonia.  PLAN:  To continue IV Levaquin.  Continue clear liquid diet.  Continue to monitor blood sugars and sliding scale insulin.  Will obtain surgical consult.     Cindel Daugherty G. Renard Matter, MD     AGM/MEDQ  D:  06/07/2011  T:  06/07/2011  Job:  [id number]

## 2011-06-08 LAB — GLUCOSE, CAPILLARY
Glucose-Capillary: 88 mg/dL (ref 70–99)
Glucose-Capillary: 105 mg/dL — ABNORMAL HIGH (ref 70–99)
Glucose-Capillary: 96 mg/dL (ref 70–99)
Glucose-Capillary: 125 mg/dL — ABNORMAL HIGH (ref 70–99)
Glucose-Capillary: 98 mg/dL (ref 70–99)

## 2011-06-08 MED ORDER — AMLODIPINE BESYLATE 5 MG PO TABS
10.0000 mg | ORAL_TABLET | Freq: Every day | ORAL | Status: DC
  Administered 2011-06-08 – 2011-06-09 (×2): 10 mg via ORAL
  Filled 2011-06-08: qty 1
  Filled 2011-06-08: qty 2

## 2011-06-08 MED ORDER — CLONIDINE HCL 0.1 MG PO TABS
0.1000 mg | ORAL_TABLET | ORAL | Status: DC | PRN
  Administered 2011-06-08: 0.1 mg via ORAL
  Filled 2011-06-08: qty 1

## 2011-06-08 MED ORDER — METOPROLOL SUCCINATE ER 50 MG PO TB24
100.0000 mg | ORAL_TABLET | Freq: Every day | ORAL | Status: DC
  Administered 2011-06-08 – 2011-06-09 (×2): 100 mg via ORAL
  Filled 2011-06-08 (×2): qty 2

## 2011-06-08 NOTE — Progress Notes (Signed)
UR Chart Review Completed  

## 2011-06-08 NOTE — Consult Note (Signed)
Reason for Consult: Nausea vomiting and abdominal pain Referring Physician: [facility] hospitalist  Benjamin Moreno is an 56 y.o. male.  HPI: Patient presents to [facility] with nausea vomiting and diffuse abdominal discomfort. Patient states it had similar symptomatology in the past approximately 2 years ago at which time distally and the bowel obstruction. Both the patient and wife admit that he has had some lesser episodes in the duration between however these have never required presentation to the hospital. He states the nausea continue to increase approximately 3 days ago. His last bowel movement was about the same time. He also noticed a decrease in flatus. He denies any current fever chills but did have some associated fevers at the time of admission. Emesis has been nonbloody. As bowel movement was reported as normal. He denies any dysuria. No flank pain.  Currently passing flatus.  Past Medical History  Diagnosis Date  . Bowel obstruction   . Hypertension   . Diabetes mellitus   . GERD (gastroesophageal reflux disease)     Past Surgical History  Procedure Date  . Cataract extraction     Family History  Problem Relation Age of Onset  . Cancer Mother   . Heart disease Father     Social History:  reports that he has never smoked. He does not have any smokeless tobacco history on file. He reports that he does not drink alcohol or use illicit drugs.  Allergies: No Known Allergies  Medications:  I have reviewed the patient's current medications. Prior to Admission:  Prescriptions prior to admission  Medication Sig Dispense Refill  . amLODipine (NORVASC) 10 MG tablet Take 10 mg by mouth daily.      Marland Kitchen glimepiride (AMARYL) 1 MG tablet Take 1 mg by mouth daily.      . metFORMIN (GLUCOPHAGE) 500 MG tablet Take 500 mg by mouth 2 (two) times daily.      . metoprolol succinate (TOPROL-XL) 100 MG 24 hr tablet Take 100 mg by mouth daily. Take with or immediately following a meal.       . pantoprazole (PROTONIX) 40 MG tablet Take 40 mg by mouth daily.       Scheduled:   . antiseptic oral rinse  15 mL Mouth Rinse BID  . insulin aspart  0-15 Units Subcutaneous Q4H  . levofloxacin (LEVAQUIN) IV  500 mg Intravenous Q24H  . ondansetron (ZOFRAN) IV  4 mg Intravenous Once  . pantoprazole (PROTONIX) IV  40 mg Intravenous Q24H   Continuous:   . dextrose 5 % and 0.45% NaCl 100 mL/hr at 06/07/11 2345   WUJ:[id number], acetaminophen, HYDROmorphone (DILAUDID) injection, ondansetron (ZOFRAN) IV, ondansetron  Results for orders placed during the hospital encounter of 06/06/11 (from the past 48 hour(s))  CBC     Status: Abnormal   Collection Time   06/06/11  9:03 PM      Component Value Range Comment   WBC 12.5 (*) 4.0 - 10.5 (K/uL)    RBC 5.73  4.22 - 5.81 (MIL/uL)    Hemoglobin 18.2 (*) 13.0 - 17.0 (g/dL)    HCT 13.0  86.5 - 78.4 (%)    MCV 89.0  78.0 - 100.0 (fL)    MCH 31.8  26.0 - 34.0 (pg)    MCHC 35.7  30.0 - 36.0 (g/dL)    RDW 69.6  29.5 - 28.4 (%)    Platelets 246  150 - 400 (K/uL)   DIFFERENTIAL     Status: Abnormal   Collection  Time   06/06/11  9:03 PM      Component Value Range Comment   Neutrophils Relative 72  43 - 77 (%)    Lymphocytes Relative 24  12 - 46 (%)    Monocytes Relative 4  3 - 12 (%)    Eosinophils Relative 0  0 - 5 (%)    Basophils Relative 0  0 - 1 (%)    Band Neutrophils 0  0 - 10 (%)    Metamyelocytes Relative 0      Myelocytes 0      Promyelocytes Absolute 0      Blasts 0      nRBC 0  0 (/100 WBC)    Neutro Abs 9.0 (*) 1.7 - 7.7 (K/uL)    Lymphs Abs 3.0  0.7 - 4.0 (K/uL)    Monocytes Absolute 0.5  0.1 - 1.0 (K/uL)    Eosinophils Absolute 0.0  0.0 - 0.7 (K/uL)    Basophils Absolute 0.0  0.0 - 0.1 (K/uL)   COMPREHENSIVE METABOLIC PANEL     Status: Abnormal   Collection Time   06/06/11  9:03 PM      Component Value Range Comment   Sodium 135  135 - 145 (mEq/L)    Potassium 4.0  3.5 - 5.1 (mEq/L)    Chloride 95 (*) 96 - 112  (mEq/L)    CO2 28  19 - 32 (mEq/L)    Glucose, Bld 116 (*) 70 - 99 (mg/dL)    BUN 37 (*) 6 - 23 (mg/dL)    Creatinine, Ser 1.61 (*) 0.50 - 1.35 (mg/dL)    Calcium 09.6  8.4 - 10.5 (mg/dL)    Total Protein 8.3  6.0 - 8.3 (g/dL)    Albumin 4.2  3.5 - 5.2 (g/dL)    AST 32  0 - 37 (U/L)    ALT 39  0 - 53 (U/L)    Alkaline Phosphatase 78  39 - 117 (U/L)    Total Bilirubin 0.9  0.3 - 1.2 (mg/dL)    GFR calc non Af Amer 40 (*) >90 (mL/min)    GFR calc Af Amer 47 (*) >90 (mL/min)   LIPASE, BLOOD     Status: Normal   Collection Time   06/06/11  9:03 PM      Component Value Range Comment   Lipase 36  11 - 59 (U/L)   URINALYSIS, ROUTINE W REFLEX MICROSCOPIC     Status: Abnormal   Collection Time   06/07/11  1:20 AM      Component Value Range Comment   Color, Urine YELLOW  YELLOW     APPearance CLEAR  CLEAR     Specific Gravity, Urine 1.015  1.005 - 1.030     pH 5.5  5.0 - 8.0     Glucose, UA NEGATIVE  NEGATIVE (mg/dL)    Hgb urine dipstick TRACE (*) NEGATIVE     Bilirubin Urine NEGATIVE  NEGATIVE     Ketones, ur TRACE (*) NEGATIVE (mg/dL)    Protein, ur 30 (*) NEGATIVE (mg/dL)    Urobilinogen, UA 0.2  0.0 - 1.0 (mg/dL)    Nitrite NEGATIVE  NEGATIVE     Leukocytes, UA NEGATIVE  NEGATIVE    URINE MICROSCOPIC-ADD ON     Status: Normal   Collection Time   06/07/11  1:20 AM      Component Value Range Comment   Squamous Epithelial / LPF RARE  RARE     WBC, UA  0-2  <3 (WBC/hpf)    RBC / HPF 0-2  <3 (RBC/hpf)    Bacteria, UA RARE  RARE    GLUCOSE, CAPILLARY     Status: Abnormal   Collection Time   06/07/11  4:50 AM      Component Value Range Comment   Glucose-Capillary 108 (*) 70 - 99 (mg/dL)   COMPREHENSIVE METABOLIC PANEL     Status: Abnormal   Collection Time   06/07/11  6:40 AM      Component Value Range Comment   Sodium 134 (*) 135 - 145 (mEq/L)    Potassium 3.7  3.5 - 5.1 (mEq/L)    Chloride 100  96 - 112 (mEq/L)    CO2 25  19 - 32 (mEq/L)    Glucose, Bld 97  70 - 99 (mg/dL)     BUN 27 (*) 6 - 23 (mg/dL)    Creatinine, Ser 1.61  0.50 - 1.35 (mg/dL)    Calcium 9.0  8.4 - 10.5 (mg/dL)    Total Protein 7.1  6.0 - 8.3 (g/dL)    Albumin 3.4 (*) 3.5 - 5.2 (g/dL)    AST 25  0 - 37 (U/L) ICTERUS AT THIS LEVEL MAY AFFECT RESULT   ALT 30  0 - 53 (U/L)    Alkaline Phosphatase 64  39 - 117 (U/L)    Total Bilirubin 0.7  0.3 - 1.2 (mg/dL)    GFR calc non Af Amer 62 (*) >90 (mL/min)    GFR calc Af Amer 72 (*) >90 (mL/min)   CBC     Status: Normal   Collection Time   06/07/11  6:40 AM      Component Value Range Comment   WBC 8.8  4.0 - 10.5 (K/uL)    RBC 5.20  4.22 - 5.81 (MIL/uL)    Hemoglobin 16.3  13.0 - 17.0 (g/dL)    HCT 09.6  04.5 - 40.9 (%)    MCV 90.0  78.0 - 100.0 (fL)    MCH 31.3  26.0 - 34.0 (pg)    MCHC 34.8  30.0 - 36.0 (g/dL)    RDW 81.1  91.4 - 78.2 (%)    Platelets 220  150 - 400 (K/uL)   HEMOGLOBIN A1C     Status: Abnormal   Collection Time   06/07/11  6:40 AM      Component Value Range Comment   Hemoglobin A1C 5.8 (*) <5.7 (%)    Mean Plasma Glucose 120 (*) <117 (mg/dL)   GLUCOSE, CAPILLARY     Status: Normal   Collection Time   06/07/11  7:18 AM      Component Value Range Comment   Glucose-Capillary 87  70 - 99 (mg/dL)    Comment 1 Notify RN      Comment 2 Documented in Chart     GLUCOSE, CAPILLARY     Status: Normal   Collection Time   06/07/11 11:42 AM      Component Value Range Comment   Glucose-Capillary 99  70 - 99 (mg/dL)    Comment 1 Notify RN      Comment 2 Documented in Chart     GLUCOSE, CAPILLARY     Status: Normal   Collection Time   06/07/11  4:58 PM      Component Value Range Comment   Glucose-Capillary 88  70 - 99 (mg/dL)    Comment 1 Notify RN      Comment 2 Documented in Chart  GLUCOSE, CAPILLARY     Status: Normal   Collection Time   06/07/11  7:53 PM      Component Value Range Comment   Glucose-Capillary 98  70 - 99 (mg/dL)   GLUCOSE, CAPILLARY     Status: Normal   Collection Time   06/07/11 11:42 PM      Component  Value Range Comment   Glucose-Capillary 94  70 - 99 (mg/dL)   GLUCOSE, CAPILLARY     Status: Normal   Collection Time   06/08/11  4:04 AM      Component Value Range Comment   Glucose-Capillary 88  70 - 99 (mg/dL)   GLUCOSE, CAPILLARY     Status: Abnormal   Collection Time   06/08/11  7:25 AM      Component Value Range Comment   Glucose-Capillary 105 (*) 70 - 99 (mg/dL)     Ct Abdomen Pelvis W Contrast  06/06/2011  *RADIOLOGY REPORT*  Clinical Data: Abdominal pain, nausea and vomiting.  CT ABDOMEN AND PELVIS WITH CONTRAST  Technique:  Multidetector CT imaging of the abdomen and pelvis was performed following the standard protocol during bolus administration of intravenous contrast.  Contrast: OMNIPAQUE IOHEXOL 300 MG/ML  SOLN  Comparison: Plain films of the chest and abdomen earlier this same date.  CT abdomen and pelvis 09/27/2009 and 09/25/2009.  Findings: There is patchy airspace disease in the right middle lobe with some air bronchograms present.  Punctate sub centimeter pleural nodule in the right lower lobe on image 5 is unchanged. No pleural or pericardial effusion.  The lung bases are otherwise unremarkable.  As seen on plain films, the patient has a small bowel obstruction. Small bowel loops are dilated up to 4.4 cm.  There is no pneumatosis, free intraperitoneal air or portal venous gas.  No mass is identified.  Transition point appears to lie in the mid abdomen where an angulated loop is identified on image 55.  Distal loops are decompressed.  The colon is unremarkable.  The patient is status post appendectomy.  The gallbladder, liver, spleen, adrenal glands and pancreas are unremarkable.  There is some scarring in the left kidney.  Punctate nonobstructing stone is noted in the upper pole of the right kidney.  Small fat containing umbilical hernia is identified. There is no focal bony abnormality.  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  IMPRESSION:  1.  Study  is positive for small bowel obstruction which appears to be due to adhesions. 2.  Right middle lobe airspace disease is worrisome for pneumonia. 3.  Punctate nonobstructing stone upper pole right kidney.  Original Report Authenticated By: Bernadene Bell. D'ALESSIO, M.D.   Dg Abd Acute W/chest  06/06/2011  *RADIOLOGY REPORT*  Clinical Data: Abdominal pain and distention.  Nausea and vomiting.  ACUTE ABDOMEN SERIES (ABDOMEN 2 VIEW & CHEST 1 VIEW)  Comparison: Chest and two views abdomen 09/28/2009.  Findings: Single view of the chest demonstrates clear lungs and normal heart size.  No pneumothorax or pleural fluid.  Two views of the abdomen show no free intraperitoneal air. Multiple dilated loops of small bowel measuring up to 5.7 cm are seen. Small bowel air-fluid levels are noted.  There is some gas in the colon.  IMPRESSION: Findings compatible with small bowel obstruction.  Original Report Authenticated By: Bernadene Bell. Maricela Curet, M.D.    Review of Systems  Constitutional: Negative for fever and chills.  HENT: Negative.   Eyes: Negative.   Respiratory:  Negative.   Cardiovascular: Negative.   Gastrointestinal: Positive for heartburn and constipation. Negative for nausea, vomiting, abdominal pain, diarrhea, blood in stool and melena.  Genitourinary: Negative.   Musculoskeletal: Negative.   Skin: Negative.   Neurological: Negative.   Endo/Heme/Allergies: Negative.   Psychiatric/Behavioral: Negative.    Blood pressure 151/82, pulse 69, temperature 98.1 F (36.7 C), temperature source Oral, resp. rate 18, height 5\' 4"  (1.626 m), weight 117.663 kg (259 lb 6.4 oz), SpO2 94.00%. Physical Exam  Constitutional: He is oriented to person, place, and time. He appears well-developed and well-nourished. No distress.       Obese  HENT:  Head: Normocephalic and atraumatic.  Eyes: Conjunctivae and EOM are normal. Pupils are equal, round, and reactive to light. No scleral icterus.  Neck: Normal range of motion. No  tracheal deviation present. No thyromegaly present.  Cardiovascular: Normal rate, regular rhythm and normal heart sounds.   Respiratory: Effort normal and breath sounds normal. No respiratory distress.  GI: Soft. He exhibits no distension and no mass. There is no tenderness. There is no rebound and no guarding.       Obese, intermittent bowel sounds.  Lymphadenopathy:    He has no cervical adenopathy.  Neurological: He is alert and oriented to person, place, and time.  Skin: Skin is warm and dry.    Assessment/Plan: Partial bowel structure versus ileus. Given patient's history I have a low suspicion of a true bowel obstruction. There is no evidence of a neoplastic etiology and adhesions would be extremely unlikely in this patient with no prior intra-abdominal surgical procedures. I did discuss possible etiologies of inflammation associated with intra-abdominal adhesions production however this would be unlikely given patient's medical history. At this time whether this is truly a partial small bowel obstruction versus an ileus his symptomatology is improving. Bowel function does appear to be increasing. Should his bowel function continue to increase I would advance the patient on his diet. No acute surgical indications at this time. While not urgent is recommended that the patient undergo colonoscopy in the near future given his age and no prior history of colonoscopy. I will continue to follow the patient during his hospitalization.  Nevaen Tredway C 06/08/2011, 11:16 AM

## 2011-06-08 NOTE — Care Management Note (Unsigned)
    Page 1 of 1   06/08/2011     11:12:20 AM   CARE MANAGEMENT NOTE 06/08/2011  Patient:  Benjamin Moreno, Benjamin Moreno   Account Number:  [id number]  Date Initiated:  06/08/2011  Documentation initiated by:  Sharrie Rothman  Subjective/Objective Assessment:   Pt admitted from home with SBO. Pt lives with wife and is independent with ADL's PTA. No surgery needed at this time.     Action/Plan:   No CM needs noted at this time.   Anticipated DC Date:  06/13/2011   Anticipated DC Plan:  HOME/SELF CARE      DC Planning Services  CM consult      Choice offered to / List presented to:             Status of service:  In process, will continue to follow Medicare Important Message given?   (If response is "NO", the following Medicare IM given date fields will be blank) Date Medicare IM given:   Date Additional Medicare IM given:    Discharge Disposition:  HOME/SELF CARE  Per UR Regulation:    If discussed at Long Length of Stay Meetings, dates discussed:    Comments:  06/08/11 1111 Arlyss Queen, RN BSN CM

## 2011-06-08 NOTE — Progress Notes (Signed)
NAMEMATILDE, MARKIE NO.:  [id number]  MEDICAL RECORD NO.:  [id number]  LOCATION:                                 FACILITY:  PHYSICIAN:  Kingsley Callander. Ouida Sills, MD       DATE OF BIRTH:  [birth year]  DATE OF PROCEDURE:  06/08/2011 DATE OF DISCHARGE:                                PROGRESS NOTE   SUBJECTIVE:  Mr. Benjamin Moreno was admitted on Saturday after developing vomiting and abdominal pain.  He was found to have small bowel obstruction, which he has had before.  His CT scan revealed a small bowel obstruction, likely due to an adhesion.  His white count was 12.5. He was also found to have evidence of a right middle lobe pneumonia with patchy airspace disease and air bronchograms on his CT scan.  This did not show up an acute abdominal series.  His white count was essentially normalized at 8.8.  His vomiting has stopped.  He is passing gas.  He took in a small amount of clear liquids last night.  He has a surgical consultation pending with Dr. Leticia Penna.  He has hypertension and diabetes, however, amlodipine, metoprolol, glimepiride, and metformin were all on hold.  He was dehydrated and looks like initially with a BUN and creatinine of 37 and 1.82.  BUN and creatinine have improved to 27 and 1.27 with IV hydration.  His diabetes is under satisfactory control with a glucose of 105 this morning.  OBJECTIVE:  VITAL SIGNS:  Temperature 97.9, pulse 66, respirations 16, blood pressure 128/77. GENERAL:  He appears in no distress. SKIN:  He is comfortable appearing. LUNGS:  Clear. HEART:  Regular with no murmurs. ABDOMEN:  Normal bowel sounds with no distention.  No significant tenderness or hepatosplenomegaly.  IMPRESSION/PLAN: 1. Recurrent small bowel obstruction.  Surgical consultation is     pending.  It would not appear as though he would benefit from a     laparotomy at this point. 2. Pneumonia.  Continue IV Levaquin. 3. Diabetes.  Continue Accu-Cheks with sliding scale  NovoLog as     needed.  Continue holding oral agents.  His hemoglobin A1c is 5.8. 4. Hypertension.  Continue observation without amlodipine or     metoprolol at this point.     Kingsley Callander. Ouida Sills, MD    ROF/MEDQ  D:  06/08/2011  T:  06/08/2011  Job:  [id number]

## 2011-06-08 NOTE — Progress Notes (Deleted)
Contacted Dr. Sudie Bailey regarding pt's BP 187/107.  Waiting to hear back.

## 2011-06-08 NOTE — Progress Notes (Signed)
Contacted Dr. Sudie Bailey regarding pt's blood pressure 189/107.  He states that he "feels fine".  He reports that he takes blood pressure medication at home.  Waiting to hear back from Dr. Sudie Bailey.

## 2011-06-08 NOTE — Progress Notes (Signed)
Pt c/o of abd pain.  PRN Dilaudid administered.  Pt report pain = 0.

## 2011-06-09 LAB — BASIC METABOLIC PANEL
BUN: 5 mg/dL — ABNORMAL LOW (ref 6–23)
CO2: 28 mEq/L (ref 19–32)
Chloride: 99 mEq/L (ref 96–112)
Glucose, Bld: 103 mg/dL — ABNORMAL HIGH (ref 70–99)
Potassium: 3.5 mEq/L (ref 3.5–5.1)
Sodium: 138 mEq/L (ref 135–145)

## 2011-06-09 LAB — GLUCOSE, CAPILLARY
Glucose-Capillary: 94 mg/dL (ref 70–99)
Glucose-Capillary: 95 mg/dL (ref 70–99)

## 2011-06-09 MED ORDER — POTASSIUM CHLORIDE CRYS ER 20 MEQ PO TBCR
20.0000 meq | EXTENDED_RELEASE_TABLET | Freq: Three times a day (TID) | ORAL | Status: DC
Start: 2011-06-09 — End: 2011-06-09
  Administered 2011-06-09 (×2): 20 meq via ORAL
  Filled 2011-06-09 (×2): qty 1

## 2011-06-09 MED ORDER — LEVOFLOXACIN 500 MG PO TABS: 500.0000 mg | ORAL_TABLET | Freq: Every day | ORAL | Status: AC

## 2011-06-09 NOTE — Progress Notes (Signed)
Pt stated verbal understanding of d/c instructions.  Provided with hard copy of Levaquin prescription.  IV dc'd tolerated well.

## 2011-06-10 NOTE — Progress Notes (Signed)
Benjamin Moreno, Benjamin Moreno                  ACCOUNT NO.:  [id number]  MEDICAL RECORD NO.:  [id number]  LOCATION:                                 FACILITY:  PHYSICIAN:  Kingsley Callander. Ouida Sills, MD       DATE OF BIRTH:  [birth year]  DATE OF PROCEDURE: DATE OF DISCHARGE:                                PROGRESS NOTE   SUBJECTIVE:  He is feeling better.  He had 1 episode of pain yesterday. He has not had any vomiting.  He has been seen in surgical consultation by Dr. Leticia Penna.  He is passing gas.  He has not had a stool.  OBJECTIVE:  VITAL SIGNS:  His blood pressure elevated yesterday one time to 189/107.  His blood pressure this morning is 126/80 with a pulse of 62, respirations 18, and temperature of 98. LUNGS:  Clear.  He is still having an occasional cough. HEART:  Regular with no murmurs. ABDOMEN:  Soft and nontender with no distention or hepatosplenomegaly. EXTREMITIES:  Reveal no edema.  IMPRESSION/PLAN: 1. Partial small bowel obstruction, resolving.  We will advance his     diet to full liquids and if he tolerates that well for breakfast, a     bland diet.  He will ambulate in the home. 2. Pneumonia.  Continue Levaquin. 3. Dehydration, resolved.  BUN and creatinine have dropped from 37 and     1.82 to 5 and 0.85. 4. Diabetes.  Glucose is 107 this morning.  He remains off his oral     hypoglycemics. 5. Hypertension.  He has been restarted on amlodipine and metoprolol     XL.     Kingsley Callander. Ouida Sills, MD     ROF/MEDQ  D:  06/09/2011  T:  06/09/2011  Job:  [id number]

## 2011-06-10 NOTE — Discharge Summary (Signed)
[name], Benjamin Moreno NO.:  [id number]  MEDICAL RECORD NO.:  [id number]  LOCATION:                                 FACILITY:  PHYSICIAN:  Kingsley Callander. Ouida Sills, MD            DATE OF BIRTH:  DATE OF ADMISSION:  [birth year] DATE OF DISCHARGE:  LH                              DISCHARGE SUMMARY   DISCHARGE DIAGNOSES: 1. Partial small bowel obstruction. 2. Pneumonia 3. Dehydration. 4. Diabetes  5.  hypertension HOSPITAL COURSE:  This patient is a 56 year old male who presented with abdominal pain and vomiting.  He underwent a CT scan of the abdomen which was felt to reveal a small bowel obstruction.  He was treated with IV fluids, antiemetics, and analgesics, attempts at NG tube placement were unsuccessful.  He was seen in surgical consultation by Dr. Leticia Penna. He was felt to be an unlikely candidate for an adhesion related bowel obstruction since he has not had previous abdominal surgery.  His symptoms improved with conservative treatment.  His diet was advanced to clear liquids, then full liquids, then a bland diet.  He tolerated this without difficulty.  He has had previous similar episodes with his abdomen in the past. There has been no definitive calls identified.  There was evidence of probable pneumonia on the CT scan of his abdomen. There was patchy airspace disease in the right middle lobe with air bronchograms.  He was treated empirically with IV Levaquin.  His initial white count was elevated at 12.5 and normalized to 8.8.  He was dehydrated on admission with a BUN and creatinine of 37 and 1.82 with IV fluids.  His BUN and creatinine dropped to 27 and 1.27, and then 5 and 0.85.  His oral hypoglycemics were held and his diabetes remains satisfactorily controlled.  His glucoses on the day of discharge were 107, 90, and 108.  Hypertension has been treated with amlodipine and metoprolol.  His condition is much improved on the evening of the 14th.  He will  be discharged to home this evening and will follow up in the office in 1 week.  Moderation with activity was stressed.  DISCHARGE MEDICATIONS:  Levaquin 500 mg daily for 7 days, metformin 500 mg b.i.d.,  glimepiride 1 mg daily, amlodipine 10 mg daily, Toprol-XL 100 mg daily, Protonix 40 mg daily.     Kingsley Callander. Ouida Sills, MD     ROF/MEDQ  D:  06/09/2011  T:  06/09/2011  Job:  [id number]

## 2011-07-10 ENCOUNTER — Ambulatory Visit: Payer: 59 | Admitting: Urgent Care

## 2011-07-17 ENCOUNTER — Telehealth: Payer: Self-pay | Admitting: Urgent Care

## 2011-07-17 ENCOUNTER — Ambulatory Visit: Payer: 59 | Admitting: Urgent Care

## 2011-07-17 NOTE — Telephone Encounter (Signed)
Noted  

## 2011-07-17 NOTE — Telephone Encounter (Signed)
Pt was a no show

## 2011-07-20 ENCOUNTER — Encounter: Payer: Self-pay | Admitting: Urgent Care

## 2012-06-09 ENCOUNTER — Other Ambulatory Visit (HOSPITAL_COMMUNITY): Payer: Self-pay | Admitting: Internal Medicine

## 2012-06-09 ENCOUNTER — Ambulatory Visit (HOSPITAL_COMMUNITY)
Admission: RE | Admit: 2012-06-09 | Discharge: 2012-06-09 | Disposition: A | Discharge status: Home / Self Care | Payer: 59 | Source: Ambulatory Visit | Attending: Internal Medicine | Admitting: Internal Medicine

## 2012-06-09 DIAGNOSIS — M25469 Effusion, unspecified knee: Secondary | ICD-10-CM | POA: Insufficient documentation

## 2012-06-09 DIAGNOSIS — M25561 Pain in right knee: Secondary | ICD-10-CM

## 2012-06-09 DIAGNOSIS — M25569 Pain in unspecified knee: Secondary | ICD-10-CM | POA: Insufficient documentation

## 2014-08-29 ENCOUNTER — Other Ambulatory Visit (HOSPITAL_COMMUNITY): Payer: Self-pay | Admitting: Respiratory Therapy

## 2014-08-29 DIAGNOSIS — G4730 Sleep apnea, unspecified: Secondary | ICD-10-CM

## 2014-09-03 ENCOUNTER — Other Ambulatory Visit (HOSPITAL_COMMUNITY): Payer: Self-pay | Admitting: Respiratory Therapy

## 2016-04-09 DIAGNOSIS — E119 Type 2 diabetes mellitus without complications: Secondary | ICD-10-CM | POA: Diagnosis not present

## 2016-04-16 DIAGNOSIS — E1129 Type 2 diabetes mellitus with other diabetic kidney complication: Secondary | ICD-10-CM | POA: Diagnosis not present

## 2016-04-16 DIAGNOSIS — I10 Essential (primary) hypertension: Secondary | ICD-10-CM | POA: Diagnosis not present

## 2016-07-09 DIAGNOSIS — I10 Essential (primary) hypertension: Secondary | ICD-10-CM | POA: Diagnosis not present

## 2016-07-09 DIAGNOSIS — E119 Type 2 diabetes mellitus without complications: Secondary | ICD-10-CM | POA: Diagnosis not present

## 2016-07-09 DIAGNOSIS — Z79899 Other long term (current) drug therapy: Secondary | ICD-10-CM | POA: Diagnosis not present

## 2016-07-16 DIAGNOSIS — E1129 Type 2 diabetes mellitus with other diabetic kidney complication: Secondary | ICD-10-CM | POA: Diagnosis not present

## 2016-07-16 DIAGNOSIS — I10 Essential (primary) hypertension: Secondary | ICD-10-CM | POA: Diagnosis not present

## 2016-07-16 DIAGNOSIS — E785 Hyperlipidemia, unspecified: Secondary | ICD-10-CM | POA: Diagnosis not present

## 2016-11-12 DIAGNOSIS — E1129 Type 2 diabetes mellitus with other diabetic kidney complication: Secondary | ICD-10-CM | POA: Diagnosis not present

## 2016-11-12 DIAGNOSIS — N183 Chronic kidney disease, stage 3 (moderate): Secondary | ICD-10-CM | POA: Diagnosis not present

## 2016-11-19 DIAGNOSIS — E1129 Type 2 diabetes mellitus with other diabetic kidney complication: Secondary | ICD-10-CM | POA: Diagnosis not present

## 2016-11-19 DIAGNOSIS — I10 Essential (primary) hypertension: Secondary | ICD-10-CM | POA: Diagnosis not present

## 2017-02-05 DIAGNOSIS — E1129 Type 2 diabetes mellitus with other diabetic kidney complication: Secondary | ICD-10-CM | POA: Diagnosis not present

## 2017-02-12 DIAGNOSIS — Z23 Encounter for immunization: Secondary | ICD-10-CM | POA: Diagnosis not present

## 2017-02-12 DIAGNOSIS — E1129 Type 2 diabetes mellitus with other diabetic kidney complication: Secondary | ICD-10-CM | POA: Diagnosis not present

## 2017-02-12 DIAGNOSIS — M109 Gout, unspecified: Secondary | ICD-10-CM | POA: Diagnosis not present

## 2017-06-02 DIAGNOSIS — E1129 Type 2 diabetes mellitus with other diabetic kidney complication: Secondary | ICD-10-CM | POA: Diagnosis not present

## 2017-06-09 DIAGNOSIS — I10 Essential (primary) hypertension: Secondary | ICD-10-CM | POA: Diagnosis not present

## 2017-06-09 DIAGNOSIS — E1122 Type 2 diabetes mellitus with diabetic chronic kidney disease: Secondary | ICD-10-CM | POA: Diagnosis not present

## 2017-10-12 DIAGNOSIS — Z79899 Other long term (current) drug therapy: Secondary | ICD-10-CM | POA: Diagnosis not present

## 2017-10-12 DIAGNOSIS — E1129 Type 2 diabetes mellitus with other diabetic kidney complication: Secondary | ICD-10-CM | POA: Diagnosis not present

## 2017-10-12 DIAGNOSIS — I10 Essential (primary) hypertension: Secondary | ICD-10-CM | POA: Diagnosis not present

## 2017-10-19 DIAGNOSIS — E1129 Type 2 diabetes mellitus with other diabetic kidney complication: Secondary | ICD-10-CM | POA: Diagnosis not present

## 2017-10-19 DIAGNOSIS — Z23 Encounter for immunization: Secondary | ICD-10-CM | POA: Diagnosis not present

## 2017-10-19 DIAGNOSIS — R945 Abnormal results of liver function studies: Secondary | ICD-10-CM | POA: Diagnosis not present

## 2017-10-19 DIAGNOSIS — E785 Hyperlipidemia, unspecified: Secondary | ICD-10-CM | POA: Diagnosis not present

## 2017-12-22 DIAGNOSIS — H26491 Other secondary cataract, right eye: Secondary | ICD-10-CM | POA: Diagnosis not present

## 2017-12-22 DIAGNOSIS — H25092 Other age-related incipient cataract, left eye: Secondary | ICD-10-CM | POA: Diagnosis not present

## 2017-12-30 DIAGNOSIS — H26491 Other secondary cataract, right eye: Secondary | ICD-10-CM | POA: Diagnosis not present

## 2018-01-14 DIAGNOSIS — E119 Type 2 diabetes mellitus without complications: Secondary | ICD-10-CM | POA: Diagnosis not present

## 2018-01-14 DIAGNOSIS — H2512 Age-related nuclear cataract, left eye: Secondary | ICD-10-CM | POA: Diagnosis not present

## 2018-01-14 DIAGNOSIS — Z01818 Encounter for other preprocedural examination: Secondary | ICD-10-CM | POA: Diagnosis not present

## 2018-01-17 DIAGNOSIS — H25812 Combined forms of age-related cataract, left eye: Secondary | ICD-10-CM | POA: Diagnosis not present

## 2018-01-17 DIAGNOSIS — H2512 Age-related nuclear cataract, left eye: Secondary | ICD-10-CM | POA: Diagnosis not present

## 2018-01-24 DIAGNOSIS — E1129 Type 2 diabetes mellitus with other diabetic kidney complication: Secondary | ICD-10-CM | POA: Diagnosis not present

## 2018-01-24 DIAGNOSIS — Z79899 Other long term (current) drug therapy: Secondary | ICD-10-CM | POA: Diagnosis not present

## 2018-01-24 DIAGNOSIS — I10 Essential (primary) hypertension: Secondary | ICD-10-CM | POA: Diagnosis not present

## 2018-01-24 DIAGNOSIS — E785 Hyperlipidemia, unspecified: Secondary | ICD-10-CM | POA: Diagnosis not present

## 2018-01-31 DIAGNOSIS — R945 Abnormal results of liver function studies: Secondary | ICD-10-CM | POA: Diagnosis not present

## 2018-01-31 DIAGNOSIS — E1129 Type 2 diabetes mellitus with other diabetic kidney complication: Secondary | ICD-10-CM | POA: Diagnosis not present

## 2018-05-30 DIAGNOSIS — E1129 Type 2 diabetes mellitus with other diabetic kidney complication: Secondary | ICD-10-CM | POA: Diagnosis not present

## 2018-05-30 DIAGNOSIS — G9009 Other idiopathic peripheral autonomic neuropathy: Secondary | ICD-10-CM | POA: Diagnosis not present

## 2019-02-16 DIAGNOSIS — E1129 Type 2 diabetes mellitus with other diabetic kidney complication: Secondary | ICD-10-CM | POA: Diagnosis not present

## 2019-02-16 DIAGNOSIS — Z79899 Other long term (current) drug therapy: Secondary | ICD-10-CM | POA: Diagnosis not present

## 2019-02-16 DIAGNOSIS — E785 Hyperlipidemia, unspecified: Secondary | ICD-10-CM | POA: Diagnosis not present

## 2019-02-16 DIAGNOSIS — R945 Abnormal results of liver function studies: Secondary | ICD-10-CM | POA: Diagnosis not present

## 2019-02-16 DIAGNOSIS — M109 Gout, unspecified: Secondary | ICD-10-CM | POA: Diagnosis not present

## 2019-02-21 DIAGNOSIS — E1129 Type 2 diabetes mellitus with other diabetic kidney complication: Secondary | ICD-10-CM | POA: Diagnosis not present

## 2019-02-21 DIAGNOSIS — R945 Abnormal results of liver function studies: Secondary | ICD-10-CM | POA: Diagnosis not present

## 2019-03-22 DIAGNOSIS — Z23 Encounter for immunization: Secondary | ICD-10-CM | POA: Diagnosis not present

## 2019-04-19 DIAGNOSIS — Z23 Encounter for immunization: Secondary | ICD-10-CM | POA: Diagnosis not present

## 2019-05-12 DIAGNOSIS — E1129 Type 2 diabetes mellitus with other diabetic kidney complication: Secondary | ICD-10-CM | POA: Diagnosis not present

## 2019-05-12 DIAGNOSIS — D751 Secondary polycythemia: Secondary | ICD-10-CM | POA: Diagnosis not present

## 2019-05-22 DIAGNOSIS — E1129 Type 2 diabetes mellitus with other diabetic kidney complication: Secondary | ICD-10-CM | POA: Diagnosis not present

## 2019-05-22 DIAGNOSIS — D751 Secondary polycythemia: Secondary | ICD-10-CM | POA: Diagnosis not present

## 2019-07-24 ENCOUNTER — Inpatient Hospital Stay (HOSPITAL_COMMUNITY): Payer: BC Managed Care – PPO

## 2019-07-24 ENCOUNTER — Other Ambulatory Visit: Payer: Self-pay

## 2019-07-24 ENCOUNTER — Encounter (HOSPITAL_COMMUNITY): Payer: Self-pay | Admitting: *Deleted

## 2019-07-24 ENCOUNTER — Inpatient Hospital Stay (HOSPITAL_COMMUNITY)
Admission: EM | Admit: 2019-07-24 | Discharge: 2019-07-27 | DRG: 389 | Disposition: A | Discharge status: Home / Self Care | Payer: BC Managed Care – PPO | Attending: Family Medicine | Admitting: Family Medicine

## 2019-07-24 ENCOUNTER — Emergency Department (HOSPITAL_COMMUNITY): Payer: BC Managed Care – PPO

## 2019-07-24 DIAGNOSIS — E119 Type 2 diabetes mellitus without complications: Secondary | ICD-10-CM | POA: Diagnosis not present

## 2019-07-24 DIAGNOSIS — E785 Hyperlipidemia, unspecified: Secondary | ICD-10-CM | POA: Diagnosis present

## 2019-07-24 DIAGNOSIS — A630 Anogenital (venereal) warts: Secondary | ICD-10-CM | POA: Diagnosis not present

## 2019-07-24 DIAGNOSIS — K56609 Unspecified intestinal obstruction, unspecified as to partial versus complete obstruction: Secondary | ICD-10-CM | POA: Diagnosis not present

## 2019-07-24 DIAGNOSIS — N179 Acute kidney failure, unspecified: Secondary | ICD-10-CM | POA: Diagnosis not present

## 2019-07-24 DIAGNOSIS — I10 Essential (primary) hypertension: Secondary | ICD-10-CM | POA: Diagnosis not present

## 2019-07-24 DIAGNOSIS — K760 Fatty (change of) liver, not elsewhere classified: Secondary | ICD-10-CM | POA: Diagnosis not present

## 2019-07-24 DIAGNOSIS — Z79899 Other long term (current) drug therapy: Secondary | ICD-10-CM

## 2019-07-24 DIAGNOSIS — K429 Umbilical hernia without obstruction or gangrene: Secondary | ICD-10-CM | POA: Diagnosis not present

## 2019-07-24 DIAGNOSIS — K5989 Other specified functional intestinal disorders: Secondary | ICD-10-CM | POA: Diagnosis not present

## 2019-07-24 DIAGNOSIS — R1310 Dysphagia, unspecified: Secondary | ICD-10-CM | POA: Diagnosis present

## 2019-07-24 DIAGNOSIS — E669 Obesity, unspecified: Secondary | ICD-10-CM | POA: Diagnosis present

## 2019-07-24 DIAGNOSIS — K56600 Partial intestinal obstruction, unspecified as to cause: Secondary | ICD-10-CM | POA: Diagnosis not present

## 2019-07-24 DIAGNOSIS — R0602 Shortness of breath: Secondary | ICD-10-CM | POA: Diagnosis not present

## 2019-07-24 DIAGNOSIS — Z20822 Contact with and (suspected) exposure to covid-19: Secondary | ICD-10-CM | POA: Diagnosis present

## 2019-07-24 DIAGNOSIS — K219 Gastro-esophageal reflux disease without esophagitis: Secondary | ICD-10-CM | POA: Diagnosis not present

## 2019-07-24 DIAGNOSIS — E860 Dehydration: Secondary | ICD-10-CM | POA: Diagnosis present

## 2019-07-24 DIAGNOSIS — K3189 Other diseases of stomach and duodenum: Secondary | ICD-10-CM | POA: Diagnosis not present

## 2019-07-24 DIAGNOSIS — Z6838 Body mass index (BMI) 38.0-38.9, adult: Secondary | ICD-10-CM

## 2019-07-24 DIAGNOSIS — K7689 Other specified diseases of liver: Secondary | ICD-10-CM | POA: Diagnosis not present

## 2019-07-24 DIAGNOSIS — D751 Secondary polycythemia: Secondary | ICD-10-CM | POA: Diagnosis not present

## 2019-07-24 DIAGNOSIS — E870 Hyperosmolality and hypernatremia: Secondary | ICD-10-CM | POA: Diagnosis not present

## 2019-07-24 DIAGNOSIS — Z4682 Encounter for fitting and adjustment of non-vascular catheter: Secondary | ICD-10-CM | POA: Diagnosis not present

## 2019-07-24 DIAGNOSIS — K529 Noninfective gastroenteritis and colitis, unspecified: Secondary | ICD-10-CM | POA: Diagnosis not present

## 2019-07-24 DIAGNOSIS — K6389 Other specified diseases of intestine: Secondary | ICD-10-CM | POA: Diagnosis not present

## 2019-07-24 DIAGNOSIS — E875 Hyperkalemia: Secondary | ICD-10-CM | POA: Diagnosis not present

## 2019-07-24 DIAGNOSIS — Z794 Long term (current) use of insulin: Secondary | ICD-10-CM

## 2019-07-24 DIAGNOSIS — K6289 Other specified diseases of anus and rectum: Secondary | ICD-10-CM

## 2019-07-24 DIAGNOSIS — K5669 Other partial intestinal obstruction: Secondary | ICD-10-CM | POA: Diagnosis not present

## 2019-07-24 DIAGNOSIS — Z8719 Personal history of other diseases of the digestive system: Secondary | ICD-10-CM | POA: Diagnosis not present

## 2019-07-24 DIAGNOSIS — Z0189 Encounter for other specified special examinations: Secondary | ICD-10-CM

## 2019-07-24 DIAGNOSIS — I700 Atherosclerosis of aorta: Secondary | ICD-10-CM | POA: Diagnosis not present

## 2019-07-24 LAB — CBC WITH DIFFERENTIAL/PLATELET
Abs Immature Granulocytes: 0.02 10*3/uL (ref 0.00–0.07)
Basophils Absolute: 0.1 10*3/uL (ref 0.0–0.1)
Basophils Relative: 1 %
Eosinophils Absolute: 0.1 10*3/uL (ref 0.0–0.5)
Eosinophils Relative: 1 %
HCT: 52.1 % — ABNORMAL HIGH (ref 39.0–52.0)
Hemoglobin: 17.7 g/dL — ABNORMAL HIGH (ref 13.0–17.0)
Immature Granulocytes: 0 %
Lymphocytes Relative: 21 %
Lymphs Abs: 1.5 10*3/uL (ref 0.7–4.0)
MCH: 31.8 pg (ref 26.0–34.0)
MCHC: 34 g/dL (ref 30.0–36.0)
MCV: 93.5 fL (ref 80.0–100.0)
Monocytes Absolute: 0.7 10*3/uL (ref 0.1–1.0)
Monocytes Relative: 10 %
Neutro Abs: 4.7 10*3/uL (ref 1.7–7.7)
Neutrophils Relative %: 67 %
Platelets: 200 10*3/uL (ref 150–400)
RBC: 5.57 MIL/uL (ref 4.22–5.81)
RDW: 12.9 % (ref 11.5–15.5)
WBC: 7.1 10*3/uL (ref 4.0–10.5)
nRBC: 0 % (ref 0.0–0.2)

## 2019-07-24 LAB — URINALYSIS, ROUTINE W REFLEX MICROSCOPIC
Bacteria, UA: NONE SEEN
Bilirubin Urine: NEGATIVE
Glucose, UA: >=500 mg/dL — AB
Hgb urine dipstick: NEGATIVE
Ketones, ur: NEGATIVE mg/dL
Leukocytes,Ua: NEGATIVE
Nitrite: NEGATIVE
Protein, ur: NEGATIVE mg/dL
Specific Gravity, Urine: 1.029 (ref 1.005–1.030)
pH: 5 (ref 5.0–8.0)

## 2019-07-24 LAB — COMPREHENSIVE METABOLIC PANEL
ALT: 34 U/L (ref 0–44)
AST: 40 U/L (ref 15–41)
Albumin: 4 g/dL (ref 3.5–5.0)
Alkaline Phosphatase: 69 U/L (ref 38–126)
Anion gap: 11 (ref 5–15)
BUN: 18 mg/dL (ref 8–23)
CO2: 26 mmol/L (ref 22–32)
Calcium: 9.4 mg/dL (ref 8.9–10.3)
Chloride: 108 mmol/L (ref 98–111)
Creatinine, Ser: 1.34 mg/dL — ABNORMAL HIGH (ref 0.61–1.24)
GFR calc Af Amer: >60 mL/min (ref >60)
GFR calc non Af Amer: 56 mL/min — ABNORMAL LOW (ref >60)
Glucose, Bld: 134 mg/dL — ABNORMAL HIGH (ref 70–99)
Potassium: 5.2 mmol/L — ABNORMAL HIGH (ref 3.5–5.1)
Sodium: 145 mmol/L (ref 135–145)
Total Bilirubin: 0.9 mg/dL (ref 0.3–1.2)
Total Protein: 7.7 g/dL (ref 6.5–8.1)

## 2019-07-24 LAB — GLUCOSE, CAPILLARY: Glucose-Capillary: 89 mg/dL (ref 70–99)

## 2019-07-24 LAB — LIPASE, BLOOD: Lipase: 33 U/L (ref 11–51)

## 2019-07-24 LAB — SARS CORONAVIRUS 2 BY RT PCR (HOSPITAL ORDER, PERFORMED IN ~~LOC~~ HOSPITAL LAB): SARS Coronavirus 2: NEGATIVE

## 2019-07-24 MED ORDER — ONDANSETRON HCL 4 MG PO TABS: 4.0000 mg | ORAL_TABLET | Freq: Four times a day (QID) | ORAL | Status: DC | PRN

## 2019-07-24 MED ORDER — DIATRIZOATE MEGLUMINE & SODIUM 66-10 % PO SOLN
90.0000 mL | Freq: Once | ORAL | Status: AC
  Filled 2019-07-24: qty 90

## 2019-07-24 MED ORDER — ONDANSETRON HCL 4 MG/2ML IJ SOLN: 4.0000 mg | Freq: Four times a day (QID) | INTRAMUSCULAR | Status: DC | PRN

## 2019-07-24 MED ORDER — MORPHINE SULFATE (PF) 2 MG/ML IV SOLN
1.0000 mg | INTRAVENOUS | Status: DC | PRN
  Administered 2019-07-24: 1 mg via INTRAVENOUS
  Filled 2019-07-24: qty 1

## 2019-07-24 MED ORDER — SODIUM CHLORIDE 0.45 % IV SOLN: INTRAVENOUS | Status: DC

## 2019-07-24 MED ORDER — LIDOCAINE HCL URETHRAL/MUCOSAL 2 % EX GEL: 1.0000 "application " | Freq: Once | CUTANEOUS | Status: DC

## 2019-07-24 MED ORDER — DIATRIZOATE MEGLUMINE & SODIUM 66-10 % PO SOLN
ORAL | Status: AC
  Administered 2019-07-24: 90 mL via NASOGASTRIC
  Filled 2019-07-24: qty 90

## 2019-07-24 MED ORDER — IOHEXOL 300 MG/ML  SOLN
100.0000 mL | Freq: Once | INTRAMUSCULAR | Status: AC | PRN
  Administered 2019-07-24: 100 mL via INTRAVENOUS

## 2019-07-24 MED ORDER — DIPHENHYDRAMINE HCL 50 MG/ML IJ SOLN
25.0000 mg | Freq: Every evening | INTRAMUSCULAR | Status: DC | PRN
  Administered 2019-07-24: 25 mg via INTRAVENOUS
  Filled 2019-07-24: qty 1

## 2019-07-24 MED ORDER — INSULIN ASPART 100 UNIT/ML ~~LOC~~ SOLN: 0.0000 [IU] | Freq: Three times a day (TID) | SUBCUTANEOUS | Status: DC

## 2019-07-24 MED ORDER — SODIUM CHLORIDE 0.9 % IV BOLUS
1000.0000 mL | Freq: Once | INTRAVENOUS | Status: AC
  Administered 2019-07-24: 1000 mL via INTRAVENOUS

## 2019-07-24 MED ORDER — FENTANYL CITRATE (PF) 100 MCG/2ML IJ SOLN
50.0000 ug | Freq: Once | INTRAMUSCULAR | Status: AC
  Administered 2019-07-24: 50 ug via INTRAVENOUS
  Filled 2019-07-24: qty 2

## 2019-07-24 MED ORDER — KETOROLAC TROMETHAMINE 60 MG/2ML IM SOLN
60.0000 mg | Freq: Every day | INTRAMUSCULAR | Status: DC
  Filled 2019-07-24 (×3): qty 2

## 2019-07-24 MED ORDER — HEPARIN SODIUM (PORCINE) 5000 UNIT/ML IJ SOLN
5000.0000 [IU] | Freq: Three times a day (TID) | INTRAMUSCULAR | Status: DC
  Administered 2019-07-24 – 2019-07-27 (×9): 5000 [IU] via SUBCUTANEOUS
  Filled 2019-07-24 (×9): qty 1

## 2019-07-24 MED ORDER — PANTOPRAZOLE SODIUM 40 MG IV SOLR
40.0000 mg | INTRAVENOUS | Status: DC
  Administered 2019-07-24 – 2019-07-26 (×3): 40 mg via INTRAVENOUS
  Filled 2019-07-24 (×3): qty 40

## 2019-07-24 MED ORDER — HYDRALAZINE HCL 20 MG/ML IJ SOLN: 5.0000 mg | INTRAMUSCULAR | Status: DC | PRN

## 2019-07-24 MED ORDER — KETOROLAC TROMETHAMINE 30 MG/ML IJ SOLN
60.0000 mg | Freq: Every day | INTRAMUSCULAR | Status: DC
  Administered 2019-07-24 – 2019-07-26 (×3): 60 mg via INTRAMUSCULAR
  Filled 2019-07-24 (×4): qty 2

## 2019-07-24 NOTE — ED Provider Notes (Signed)
This patient is a 64 year old male, he does not take care of himself very well, does not go to the doctor very frequently, has had multiple either partial small bowel obstructions or bowel obstructions in the past despite never having abdominal surgery.  He has some fungating warty tissue around his rectum on my exam, his rectum is clearly accessible, his abdomen is slightly distended tympanic to percussion mildly tender, decreased bowel sounds.  He is otherwise well-appearing and agreeable to let me try an NG tube.  CT scan reviewed showing bowel obstruction, general surgery will see the patient in the morning, hospitalist to admit.  NG placement  Date/Time: 07/24/2019 6:12 PM Performed by: Noemi Chapel, MD Authorized by: Noemi Chapel, MD  Preparation: Patient was prepped and draped in the usual sterile fashion. Local anesthesia used: no  Anesthesia: Local anesthesia used: no  Sedation: Patient sedated: no  Patient tolerance: patient tolerated the procedure well with no immediate complications Comments: Confirmed by xray       Medical screening examination/treatment/procedure(s) were conducted as a shared visit with non-physician practitioner(s) and myself.  I personally evaluated the patient during the encounter.  Clinical Impression:   Final diagnoses:  SBO (small bowel obstruction) ([place])         Noemi Chapel, MD 07/24/19 [phone]

## 2019-07-24 NOTE — ED Provider Notes (Addendum)
[facility] EMERGENCY DEPARTMENT Provider Note   CSN: [id number] Arrival date & time: 07/24/19  1038     History Chief Complaint  Patient presents with  . Abdominal Pain    Benjamin Moreno is a 64 y.o. male with hx of GERD, ileus, insulin dependent DM, and HTN who presents with abdominal pain and distension.  He has been having abdominal pain on and off for months but over the past 1 week he has been having diffuse abdominal pain and distention across the middle of his abdomen.  Feels throbbing and sharp in nature and he feels short of breath.  When he eats his pain will intensify therefore he has not been eating or drinking that much.  He tried to eat an oatmeal cookie and some Gatorade this morning and had severe abdominal pain with an episode of vomiting therefore he decided to come to the ER because he cannot deal with the pain anymore.  He has not had a normal bowel movement or been passing gas in about 1 week.  He states that sometimes he will have "squirts" of a bowel movement. He was previously admitted for a small bowel obstruction in 2013 which was determined to more likely be an ileus and it resolved on its own with bowel rest.  He has not had any prior abdominal surgeries.  He was recommended to have a colonoscopy done which he never did.  He reports a rectal mass but states that this has not been bothering him.  He denies fever, chills, chest pain.   HPI     Past Medical History:  Diagnosis Date  . Bowel obstruction ([place])   . Diabetes mellitus   . GERD (gastroesophageal reflux disease)   . Hypertension     Patient Active Problem List   Diagnosis Date Noted  . SBO (small bowel obstruction) ([place]) 06/06/2011  . HTN (hypertension) 06/06/2011  . DM type 2 (diabetes mellitus, type 2) ([place]) 06/06/2011  . Obesity 06/06/2011  . ARF (acute renal failure) ([place]) 06/06/2011  . Aspiration pneumonia ([place]) 06/06/2011  . ABDOMINAL PAIN OTHER SPECIFIED SITE 10/02/2009    Past  Surgical History:  Procedure Laterality Date  . CATARACT EXTRACTION         Family History  Problem Relation Age of Onset  . Cancer Mother   . Heart disease Father     Social History   Tobacco Use  . Smoking status: Never Smoker  . Smokeless tobacco: Never Used  Vaping Use  . Vaping Use: Never used  Substance Use Topics  . Alcohol use: No  . Drug use: No    Home Medications Prior to Admission medications   Medication Sig Start Date End Date Taking? Authorizing Provider  amLODipine (NORVASC) 10 MG tablet Take 10 mg by mouth daily.    [provider]  glimepiride (AMARYL) 1 MG tablet Take 1 mg by mouth daily.    [provider]  metFORMIN (GLUCOPHAGE) 500 MG tablet Take 500 mg by mouth 2 (two) times daily.    [provider]  metoprolol succinate (TOPROL-XL) 100 MG 24 hr tablet Take 100 mg by mouth daily. Take with or immediately following a meal.    [provider]  pantoprazole (PROTONIX) 40 MG tablet Take 40 mg by mouth daily.    [provider]    Allergies    Patient has no known allergies.  Review of Systems   Review of Systems  Constitutional: Negative for chills and fever.  Respiratory: Positive for shortness of breath.   Cardiovascular: Negative for chest pain.  Gastrointestinal: Positive for abdominal distention, abdominal pain, nausea and vomiting. Negative for constipation and diarrhea.  Genitourinary: Negative for dysuria.  All other systems reviewed and are negative.   Physical Exam Updated Vital Signs BP (!) 142/96   Pulse 82   Temp 98.7 F (37.1 C) (Oral)   Resp 18   Ht 5\' 6"  (1.676 m)   Wt 108.9 kg   SpO2 97%   BMI 38.74 kg/m   Physical Exam Vitals and nursing note reviewed.  Constitutional:      General: He is not in acute distress.    Appearance: He is well-developed. He is obese. He is not ill-appearing.  HENT:     Head: Normocephalic and atraumatic.  Eyes:     General: No scleral  icterus.       Right eye: No discharge.        Left eye: No discharge.     Conjunctiva/sclera: Conjunctivae normal.     Pupils: Pupils are equal, round, and reactive to light.  Cardiovascular:     Rate and Rhythm: Normal rate and regular rhythm.  Pulmonary:     Effort: Pulmonary effort is normal. No respiratory distress.     Breath sounds: Normal breath sounds.  Abdominal:     General: There is distension.     Tenderness: There is abdominal tenderness (Diffuse).  Genitourinary:    Comments: Rectal exam refused Musculoskeletal:     Cervical back: Normal range of motion.  Skin:    General: Skin is warm and dry.  Neurological:     Mental Status: He is alert and oriented to person, place, and time.  Psychiatric:        Behavior: Behavior normal.     ED Results / Procedures / Treatments   Labs (all labs ordered are listed, but only abnormal results are displayed) Labs Reviewed  CBC WITH DIFFERENTIAL/PLATELET - Abnormal; Notable for the following components:      Result Value   Hemoglobin 17.7 (*)    HCT 52.1 (*)    All other components within normal limits  COMPREHENSIVE METABOLIC PANEL - Abnormal; Notable for the following components:   Potassium 5.2 (*)    Glucose, Bld 134 (*)    Creatinine, Ser 1.34 (*)    GFR calc non Af Amer 56 (*)    All other components within normal limits  URINALYSIS, ROUTINE W REFLEX MICROSCOPIC - Abnormal; Notable for the following components:   APPearance HAZY (*)    Glucose, UA >=500 (*)    All other components within normal limits  SARS CORONAVIRUS 2 BY RT PCR (HOSPITAL ORDER, [place] [facility])  LIPASE, BLOOD    EKG None  Radiology CT Abdomen Pelvis W Contrast  Result Date: 07/24/2019 CLINICAL DATA:  Abdominal pain for 1 week. EXAM: CT ABDOMEN AND PELVIS WITH CONTRAST TECHNIQUE: Multidetector CT imaging of the abdomen and pelvis was performed using the standard protocol following bolus administration of intravenous  contrast. CONTRAST:  13mL OMNIPAQUE IOHEXOL 300 MG/ML  SOLN COMPARISON:  None. FINDINGS: Lower chest: No acute abnormality. Hepatobiliary: Low-attenuation of the liver as can be seen with hepatic steatosis. Normal gallbladder. No biliary ductal dilatation. Pancreas: Unremarkable. No pancreatic ductal dilatation or surrounding inflammatory changes. Spleen: Normal in size without focal abnormality. Adrenals/Urinary Tract: Adrenal glands are unremarkable. Kidneys are normal, without renal calculi, focal lesion, or hydronephrosis. Bladder is unremarkable. Stomach/Bowel: Stomach is within  normal limits. Dilated loops of small bowel measuring up to 3.9 cm with fluid-filled loops of bowel with a relative transition point in the right lower quadrant consistent with small bowel obstruction. 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 Vascular/Lymphatic: Normal caliber abdominal aorta with mild atherosclerosis. No lymphadenopathy. Reproductive: Prostate is unremarkable. Other: Small fat containing umbilical hernia. Musculoskeletal: No acute or significant osseous findings. IMPRESSION: 1. Dilated fluid filled loops of small bowel measuring up to 3.9 cm with a relative transition point in the right lower quadrant consistent with small bowel obstruction. 2.  Aortic Atherosclerosis (ICD10-I70.0). Electronically Signed   By: Kathreen Devoid   On: 07/24/2019 15:05    Procedures Procedures (including critical care time)  Medications Ordered in ED Medications  iohexol (OMNIPAQUE) 300 MG/ML solution 100 mL (100 mLs Intravenous Contrast Given 07/24/19 1402)  sodium chloride 0.9 % bolus 1,000 mL (0 mLs Intravenous Stopped 07/24/19 1615)  fentaNYL (SUBLIMAZE) injection 50 mcg (50 mcg Intravenous Given 07/24/19  1654)    ED Course  I have reviewed the triage vital signs and the nursing notes.  Pertinent labs & imaging results that were available during my care of the patient were reviewed by me and considered in my medical decision making (see chart for details).  64 year old presents with intermittent abdominal pain for months worsening over the past couple of days.  His vital signs are reassuring.  On exam he has significant abdominal distention with mild diffuse tenderness. He has a reported rectal mass as well but pt would not let me examine this area. Shared visit with Dr. Sabra Heck - he was able to examine the patient's rectum. Please see his note for a description of the mass. Patient feels symptoms are similar to when he had a ileus several years ago.  Will obtain labs, urine, CT abdomen pelvis  CBC remarkable for mild polycythemia.  He also has a mild AKI and hyperkalemia.  He was given 1 L of fluids.  CT abdomen and pelvis shows a small bowel obstruction with a relative transition point in the right lower quadrant.  There is also a possible calcified mass in the mesentery in the right midabdomen which was present in 2013 as well.  Case was discussed with Dr. Constance Haw with general surgery who will consult on the patient tomorrow.  She is recommending NG tube and small bowel protocol.  Shared visit with Dr. Sabra Heck.  Will consult hospitalist for admission.  Discussed with Dr. Marily Memos with [facility] who will admit MDM Rules/Calculators/A&P                           Final Clinical Impression(s) / ED Diagnoses Final diagnoses:  SBO (small bowel obstruction) [facility])    Rx / DC Orders ED Discharge Orders    None       Recardo Evangelist, PA-C 07/24/19 1714    Recardo Evangelist, PA-C 07/24/19 1715    Noemi Chapel, MD 07/24/19 [phone]

## 2019-07-24 NOTE — ED Triage Notes (Signed)
Pt c/o generalized abdominal pain x one week; pt states he is unable to eat or drink and only dry heaves now

## 2019-07-24 NOTE — ED Notes (Signed)
ED TO INPATIENT HANDOFF REPORT  ED Nurse Name and Phone #:   S Name/Age/Gender Benjamin Moreno 64 y.o. male Room/Bed: APA06/APA06  Code Status   Code Status: Prior  Home/SNF/Other Home Patient oriented to: self, place, time and situation Is this baseline? Yes   Triage Complete: Triage complete  Chief Complaint Bowel obstruction ([place]) [I77.824]  Triage Note Pt c/o generalized abdominal pain x one week; pt states he is unable to eat or drink and only dry heaves now    Allergies No Known Allergies  Level of Care/Admitting Diagnosis ED Disposition    ED Disposition Condition [place]: [facility] [[id number]]  Level of Care: Med-Surg [16]  Covid Evaluation: Asymptomatic Screening Protocol (No Symptoms)  Diagnosis: Bowel obstruction [facility]) [[id number]]  Admitting Physician: Waldemar Dickens [[id number]]  Attending Physician: MERRELL, DAVID J [[id number]]  Estimated length of stay: 3 - 4 days  Certification:: I certify this patient will need inpatient services for at least 2 midnights       B Medical/Surgery History Past Medical History:  Diagnosis Date  . Bowel obstruction ([place])   . Diabetes mellitus   . GERD (gastroesophageal reflux disease)   . Hypertension    Past Surgical History:  Procedure Laterality Date  . CATARACT EXTRACTION       A IV Location/Drains/Wounds Patient Lines/Drains/Airways Status    Active Line/Drains/Airways    Name Placement date Placement time Site Days   Peripheral IV 07/24/19 Right Antecubital 07/24/19  1324  Antecubital  less than 1          Intake/Output Last 24 hours  Intake/Output Summary (Last 24 hours) at 07/24/2019 1811 Last data filed at 07/24/2019 1615 Gross per 24 hour  Intake 1000 ml  Output --  Net 1000 ml    Labs/Imaging Results for orders placed or performed during the hospital encounter of 07/24/19 (from the past 48 hour(s))  CBC with Differential     Status: Abnormal   Collection Time:  07/24/19 12:57 PM  Result Value Ref Range   WBC 7.1 4.0 - 10.5 K/uL   RBC 5.57 4.22 - 5.81 MIL/uL   Hemoglobin 17.7 (H) 13.0 - 17.0 g/dL   HCT 52.1 (H) 39 - 52 %   MCV 93.5 80.0 - 100.0 fL   MCH 31.8 26.0 - 34.0 pg   MCHC 34.0 30.0 - 36.0 g/dL   RDW 12.9 11.5 - 15.5 %   Platelets 200 150 - 400 K/uL   nRBC 0.0 0.0 - 0.2 %   Neutrophils Relative % 67 %   Neutro Abs 4.7 1.7 - 7.7 K/uL   Lymphocytes Relative 21 %   Lymphs Abs 1.5 0.7 - 4.0 K/uL   Monocytes Relative 10 %   Monocytes Absolute 0.7 0 - 1 K/uL   Eosinophils Relative 1 %   Eosinophils Absolute 0.1 0 - 0 K/uL   Basophils Relative 1 %   Basophils Absolute 0.1 0 - 0 K/uL   Immature Granulocytes 0 %   Abs Immature Granulocytes 0.02 0.00 - 0.07 K/uL    Comment: Performed at [facility], [address], [place], [place] [address]  Comprehensive metabolic panel     Status: Abnormal   Collection Time: 07/24/19 12:57 PM  Result Value Ref Range   Sodium 145 135 - 145 mmol/L   Potassium 5.2 (H) 3.5 - 5.1 mmol/L   Chloride 108 98 - 111 mmol/L   CO2 26 22 - 32 mmol/L  Glucose, Bld 134 (H) 70 - 99 mg/dL    Comment: Glucose reference range applies only to samples taken after fasting for at least 8 hours.   BUN 18 8 - 23 mg/dL   Creatinine, Ser 1.34 (H) 0.61 - 1.24 mg/dL   Calcium 9.4 8.9 - 10.3 mg/dL   Total Protein 7.7 6.5 - 8.1 g/dL   Albumin 4.0 3.5 - 5.0 g/dL   AST 40 15 - 41 U/L   ALT 34 0 - 44 U/L   Alkaline Phosphatase 69 38 - 126 U/L   Total Bilirubin 0.9 0.3 - 1.2 mg/dL   GFR calc non Af Amer 56 (L) >60 mL/min   GFR calc Af Amer >60 >60 mL/min   Anion gap 11 5 - 15    Comment: Performed at [facility], [address], [place], [place] [address]  Lipase, blood     Status: None   Collection Time: 07/24/19 12:57 PM  Result Value Ref Range   Lipase 33 11 - 51 U/L    Comment: Performed at [facility], [address], [place], [place] [address]  Urinalysis, Routine w reflex microscopic     Status: Abnormal    Collection Time: 07/24/19  1:36 PM  Result Value Ref Range   Color, Urine YELLOW YELLOW   APPearance HAZY (A) CLEAR   Specific Gravity, Urine 1.029 1.005 - 1.030   pH 5.0 5.0 - 8.0   Glucose, UA >=500 (A) NEGATIVE mg/dL   Hgb urine dipstick NEGATIVE NEGATIVE   Bilirubin Urine NEGATIVE NEGATIVE   Ketones, ur NEGATIVE NEGATIVE mg/dL   Protein, ur NEGATIVE NEGATIVE mg/dL   Nitrite NEGATIVE NEGATIVE   Leukocytes,Ua NEGATIVE NEGATIVE   WBC, UA 0-5 0 - 5 WBC/hpf   Bacteria, UA NONE SEEN NONE SEEN   Mucus PRESENT     Comment: Performed at [facility], [address] [place], [place] [address]   CT Abdomen Pelvis W Contrast  Result Date: 07/24/2019 CLINICAL DATA:  Abdominal pain for 1 week. EXAM: CT ABDOMEN AND PELVIS WITH CONTRAST TECHNIQUE: Multidetector CT imaging of the abdomen and pelvis was performed using the standard protocol following bolus administration of intravenous contrast. CONTRAST:  121mL OMNIPAQUE IOHEXOL 300 MG/ML  SOLN COMPARISON:  None. FINDINGS: Lower chest: No acute abnormality. Hepatobiliary: Low-attenuation of the liver as can be seen with hepatic steatosis. Normal gallbladder. No biliary ductal dilatation. Pancreas: Unremarkable. No pancreatic ductal dilatation or surrounding inflammatory changes. Spleen: Normal in size without focal abnormality. Adrenals/Urinary Tract: Adrenal glands are unremarkable. Kidneys are normal, without renal calculi, focal lesion, or hydronephrosis. Bladder is unremarkable. Stomach/Bowel: Stomach is within normal limits. Dilated loops of small bowel measuring up to 3.9 cm with fluid-filled loops of bowel with a relative transition point in the right lower quadrant consistent with small bowel obstruction. 2.1 x 2 cm periphery calcified mass within the mesentery in the right mid abdomen with slightly increased soft tissue density centrally compared with 06/06/2011 at which time the mass was more of fat density likely reflecting chronically infarcted  epiploic appendage. Vascular/Lymphatic: Normal caliber abdominal aorta with mild atherosclerosis. No lymphadenopathy. Reproductive: Prostate is unremarkable. Other: Small fat containing umbilical hernia. Musculoskeletal: No acute or significant osseous findings. IMPRESSION: 1. Dilated fluid filled loops of small bowel measuring up to 3.9 cm with a relative transition point in the right lower quadrant consistent with small bowel obstruction. 2.  Aortic Atherosclerosis (ICD10-I70.0). Electronically Signed   By: Kathreen Devoid   On: 07/24/2019 15:05  Pending Labs [employer] (From admission, onward) Comment          Start     Ordered   07/24/19 1628  SARS Coronavirus 2 by RT PCR (hospital order, performed in [facility] hospital lab) Nasopharyngeal Nasopharyngeal Swab  (Tier 2 (TAT 2 hrs))  Once,   STAT       Question Answer Comment  Is this test for diagnosis or screening Screening   Symptomatic for COVID-19 as defined by CDC No   Hospitalized for COVID-19 No   Admitted to ICU for COVID-19 No   Previously tested for COVID-19 No   Resident in a congregate (group) care setting No   Employed in healthcare setting No   Has patient completed COVID vaccination(s) (2 doses of Pfizer/Moderna 1 dose of [employer]) No      07/24/19 1627          Vitals/Pain Today's Vitals   07/24/19 1308 07/24/19 1330 07/24/19 1400 07/24/19 1436  BP:  (!) 145/82 131/78   Pulse:  78 77   Resp:      Temp:      TempSrc:      SpO2:  96% 96%   Weight:      Height:      PainSc: 9    9     Isolation Precautions No active isolations  Medications Medications  iohexol (OMNIPAQUE) 300 MG/ML solution 100 mL (100 mLs Intravenous Contrast Given 07/24/19 1402)  sodium chloride 0.9 % bolus 1,000 mL (0 mLs Intravenous Stopped 07/24/19 1615)  fentaNYL (SUBLIMAZE) injection 50 mcg (50 mcg Intravenous Given 07/24/19 1654)    Mobility walks Low fall risk   Focused Assessments    R Recommendations:  See Admitting Provider Note  Report given to:   Additional Notes:

## 2019-07-24 NOTE — H&P (Signed)
History and Physical    CAMP GOPAL UJW:[id number] DOB: 1955-03-06 DOA: 07/24/2019  PCP: Asencion Noble, MD Patient coming from: Home  Chief Complaint:  Abd Pain  HPI: Benjamin Moreno is a 64 y.o. male with medical history significant of recurrent bowel obstructions, DM, GERD, HTN. Pt presenting w/ 1 mo h/o abd pain and increasing abd girth. Significant worsening over past 7 days. Pain and distension diffuse w/ localization fo mid abd region. Becoming more SOB as abd becomes more distended. Minimal PO intake due ot pain and full feeling. Eating makes the pain worse. Has had before and states this is his typical sx when he develops a bowel obstruction. No BM in the past week. Typically his bowels are hard to predict, stating that often he has problems holding in his stool when he feels the urge to go. No colonoscopy. No prior abd surgeries.   ED Course: objective findings below. EDP to place NG tube  Review of Systems: As per HPI otherwise all other systems reviewed and are negative  Ambulatory Status:no restrictions.   Past Medical History:  Diagnosis Date  . Bowel obstruction ([place])   . Diabetes mellitus   . GERD (gastroesophageal reflux disease)   . Hypertension     Past Surgical History:  Procedure Laterality Date  . CATARACT EXTRACTION      Social History   Socioeconomic History  . Marital status: Married    Spouse name: Not on file  . Number of children: Not on file  . Years of education: Not on file  . Highest education level: Not on file  Occupational History  . Not on file  Tobacco Use  . Smoking status: Never Smoker  . Smokeless tobacco: Never Used  Vaping Use  . Vaping Use: Never used  Substance and Sexual Activity  . Alcohol use: No  . Drug use: No  . Sexual activity: Yes  Other Topics Concern  . Not on file  Social History Narrative  . Not on file   Social Determinants of Health   Financial Resource Strain:   . Difficulty of Paying Living Expenses:     Food Insecurity:   . Worried About Charity fundraiser in the Last Year:   . Arboriculturist in the Last Year:   Transportation Needs:   . Film/video editor (Medical):   Marland Kitchen Lack of Transportation (Non-Medical):   Physical Activity:   . Days of Exercise per Week:   . Minutes of Exercise per Session:   Stress:   . Feeling of Stress :   Social Connections:   . Frequency of Communication with Friends and Family:   . Frequency of Social Gatherings with Friends and Family:   . Attends Religious Services:   . Active Member of Clubs or Organizations:   . Attends Archivist Meetings:   Marland Kitchen Marital Status:   Intimate Partner Violence:   . Fear of Current or Ex-Partner:   . Emotionally Abused:   Marland Kitchen Physically Abused:   . Sexually Abused:     No Known Allergies  Family History  Problem Relation Age of Onset  . Cancer Mother   . Heart disease Father       Prior to Admission medications   Medication Sig Start Date End Date Taking? Authorizing Provider  amLODipine (NORVASC) 10 MG tablet Take 10 mg by mouth daily.   Yes [provider]  atorvastatin (LIPITOR) 20 MG tablet Take 20 mg by mouth daily.  07/19/19  Yes [provider]  glimepiride (AMARYL) 2 MG tablet Take 2 mg by mouth every morning. 07/07/19  Yes [provider]  INVOKANA 300 MG TABS tablet Take 300 mg by mouth daily. 07/19/19  Yes [provider]  LANTUS SOLOSTAR 100 UNIT/ML Solostar Pen Inject 45 Units into the skin every morning. 06/27/19  Yes [provider]  metFORMIN (GLUCOPHAGE) 500 MG tablet Take 500 mg by mouth 2 (two) times daily.   Yes [provider]  metoprolol succinate (TOPROL-XL) 100 MG 24 hr tablet Take 100 mg by mouth daily. Take with or immediately following a meal.   Yes [provider]  pantoprazole (PROTONIX) 40 MG tablet Take 40 mg by mouth daily.   Yes [provider]  ramipril (ALTACE) 10 MG capsule Take 10 mg by mouth  daily. 07/19/19  Yes [provider]    Physical Exam: Vitals:   07/24/19 1231 07/24/19 1300 07/24/19 1330 07/24/19 1400  BP: (!) 142/96 136/79 (!) 145/82 131/78  Pulse: 82 78 78 77  Resp:      Temp:      TempSrc:      SpO2: 97% 96% 96% 96%  Weight:      Height:         General:  Appears calm and comfortable Eyes:  PERRL, EOMI, normal lids, iris ENT:  grossly normal hearing, lips & tongue, mmm Neck:  no LAD, masses or thyromegaly Cardiovascular:  RRR, no m/r/g. No LE edema.  Respiratory:  CTA bilaterally, no w/r/r. Normal respiratory effort. Abdomen: Distended and tympanic. Mildly ttp.  Skin:  no rash or induration seen on limited exam Musculoskeletal:  grossly normal tone BUE/BLE, good ROM, no bony abnormality Psychiatric:  grossly normal mood and affect, speech fluent and appropriate, AOx3 Neurologic:  CN 2-12 grossly intact, moves all extremities in coordinated fashion, sensation intact  Labs on Admission: I have personally reviewed following labs and imaging studies  CBC: Recent Labs  Lab 07/24/19 1257  WBC 7.1  NEUTROABS 4.7  HGB 17.7*  HCT 52.1*  MCV 93.5  PLT 128   Basic Metabolic Panel: Recent Labs  Lab 07/24/19 1257  NA 145  K 5.2*  CL 108  CO2 26  GLUCOSE 134*  BUN 18  CREATININE 1.34*  CALCIUM 9.4   GFR: Estimated Creatinine Clearance: 65.3 mL/min (A) (by C-G formula based on SCr of 1.34 mg/dL (H)). Liver Function Tests: Recent Labs  Lab 07/24/19 1257  AST 40  ALT 34  ALKPHOS 69  BILITOT 0.9  PROT 7.7  ALBUMIN 4.0   Recent Labs  Lab 07/24/19 1257  LIPASE 33   No results for input(s): AMMONIA in the last 168 hours. Coagulation Profile: No results for input(s): INR, PROTIME in the last 168 hours. Cardiac Enzymes: No results for input(s): CKTOTAL, CKMB, CKMBINDEX, TROPONINI in the last 168 hours. BNP (last 3 results) No results for input(s): PROBNP in the last 8760 hours. HbA1C: No results for input(s): HGBA1C in the  last 72 hours. CBG: No results for input(s): GLUCAP in the last 168 hours. Lipid Profile: No results for input(s): CHOL, HDL, LDLCALC, TRIG, CHOLHDL, LDLDIRECT in the last 72 hours. Thyroid Function Tests: No results for input(s): TSH, T4TOTAL, FREET4, T3FREE, THYROIDAB in the last 72 hours. Anemia Panel: No results for input(s): VITAMINB12, FOLATE, FERRITIN, TIBC, IRON, RETICCTPCT in the last 72 hours. Urine analysis:    Component Value Date/Time   COLORURINE YELLOW 07/24/2019 1336   APPEARANCEUR HAZY (A) 07/24/2019  1336   LABSPEC 1.029 07/24/2019 1336   PHURINE 5.0 07/24/2019 1336   GLUCOSEU >=500 (A) 07/24/2019 1336   HGBUR NEGATIVE 07/24/2019 1336   BILIRUBINUR NEGATIVE 07/24/2019 1336   KETONESUR NEGATIVE 07/24/2019 1336   PROTEINUR NEGATIVE 07/24/2019 1336   UROBILINOGEN 0.2 06/07/2011 0120   NITRITE NEGATIVE 07/24/2019 1336   LEUKOCYTESUR NEGATIVE 07/24/2019 1336    Creatinine Clearance: Estimated Creatinine Clearance: 65.3 mL/min (A) (by C-G formula based on SCr of 1.34 mg/dL (H)).  Sepsis Labs: @LABRCNTIP (procalcitonin:4,lacticidven:4) )No results found for this or any previous visit (from the past 240 hour(s)).   Radiological Exams on Admission: CT Abdomen Pelvis W Contrast  Result Date: 07/24/2019 CLINICAL DATA:  Abdominal pain for 1 week. EXAM: CT ABDOMEN AND PELVIS WITH CONTRAST TECHNIQUE: Multidetector CT imaging of the abdomen and pelvis was performed using the standard protocol following bolus administration of intravenous contrast. CONTRAST:  140mL OMNIPAQUE IOHEXOL 300 MG/ML  SOLN COMPARISON:  None. FINDINGS: Lower chest: No acute abnormality. Hepatobiliary: Low-attenuation of the liver as can be seen with hepatic steatosis. Normal gallbladder. No biliary ductal dilatation. Pancreas: Unremarkable. No pancreatic ductal dilatation or surrounding inflammatory changes. Spleen: Normal in size without focal abnormality. Adrenals/Urinary Tract: Adrenal glands are  unremarkable. Kidneys are normal, without renal calculi, focal lesion, or hydronephrosis. Bladder is unremarkable. Stomach/Bowel: Stomach is within normal limits. Dilated loops of small bowel measuring up to 3.9 cm with fluid-filled loops of bowel with a relative transition point in the right lower quadrant consistent with small bowel obstruction. 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 Vascular/Lymphatic: Normal caliber abdominal aorta with mild atherosclerosis. No lymphadenopathy. Reproductive: Prostate is unremarkable. Other: Small fat containing umbilical hernia. Musculoskeletal: No acute or significant osseous findings. IMPRESSION: 1. Dilated fluid filled loops of small bowel measuring up to 3.9 cm with a relative transition point in the right lower quadrant consistent with small bowel obstruction. 2.  Aortic Atherosclerosis (ICD10-I70.0). Electronically Signed   By: Kathreen Devoid   On: 07/24/2019 15:05   DG Chest Portable 1 View  Result Date: 07/24/2019 CLINICAL DATA:  NG tube placement EXAM: PORTABLE CHEST 1 VIEW COMPARISON:  None. FINDINGS: NG tube coils in the fundus of the stomach. Lungs are clear. Heart is normal size. No effusions. IMPRESSION: NG tube coils in the fundus of the stomach. Electronically Signed   By: Rolm Baptise M.D.   On: 07/24/2019 18:27     EKG: None  Assessment/Plan Active Problems:   HTN (hypertension)   DM type 2 (diabetes mellitus, type 2) ([place])   AKI (acute kidney injury) ([place])   Bowel obstruction ([place])   MWN:[id number] for pt. No h/o colonoscopy. General Surgery consulted by EDP. NG placed by EDP.  - SBO protocol initiated w/ Gastrografin. - Surgery consult  Rectal Growth: Pt w/ rectal incontinence/laxity per history though not on exam.  Seen by EDP who states pt has rectal type mass. Pt resistant to having others examine him at this time.  - Consider GI consult in am for evaluation of mass/colonsocopy.   AKI: Cr 1.34. baseline 0.8. secondary to volume depletion  DM: Pt NPO - SSI  HTN: Pt unable to take home regimen as currently has SBO - IV hydralazine  HLD: - resume liptor when taking PO  GERD: - IV protonix   DVT  prophylaxis: Hep  Code Status: Full  Family Communication: Wife  Disposition Plan: pending resolution of SBO  Consults called: Surgery   Admission status: Inpt    Waldemar Dickens MD [facility]  If 7PM-7AM, please contact night-coverage [email or web address] Password [facility]  07/24/2019, 6:40 PM

## 2019-07-25 ENCOUNTER — Inpatient Hospital Stay (HOSPITAL_COMMUNITY): Payer: BC Managed Care – PPO

## 2019-07-25 ENCOUNTER — Encounter (HOSPITAL_COMMUNITY): Payer: Self-pay | Admitting: Family Medicine

## 2019-07-25 DIAGNOSIS — K5989 Other specified functional intestinal disorders: Secondary | ICD-10-CM

## 2019-07-25 DIAGNOSIS — K529 Noninfective gastroenteritis and colitis, unspecified: Secondary | ICD-10-CM

## 2019-07-25 DIAGNOSIS — A630 Anogenital (venereal) warts: Secondary | ICD-10-CM

## 2019-07-25 DIAGNOSIS — R1310 Dysphagia, unspecified: Secondary | ICD-10-CM

## 2019-07-25 LAB — COMPREHENSIVE METABOLIC PANEL
ALT: 38 U/L (ref 0–44)
AST: 48 U/L — ABNORMAL HIGH (ref 15–41)
Albumin: 3.6 g/dL (ref 3.5–5.0)
Alkaline Phosphatase: 61 U/L (ref 38–126)
Anion gap: 13 (ref 5–15)
BUN: 16 mg/dL (ref 8–23)
CO2: 22 mmol/L (ref 22–32)
Calcium: 8.7 mg/dL — ABNORMAL LOW (ref 8.9–10.3)
Chloride: 111 mmol/L (ref 98–111)
Creatinine, Ser: 1.09 mg/dL (ref 0.61–1.24)
GFR calc Af Amer: >60 mL/min (ref >60)
GFR calc non Af Amer: >60 mL/min (ref >60)
Glucose, Bld: 71 mg/dL (ref 70–99)
Potassium: 3.7 mmol/L (ref 3.5–5.1)
Sodium: 146 mmol/L — ABNORMAL HIGH (ref 135–145)
Total Bilirubin: 0.8 mg/dL (ref 0.3–1.2)
Total Protein: 7.1 g/dL (ref 6.5–8.1)

## 2019-07-25 LAB — CBC
HCT: 50.8 % (ref 39.0–52.0)
Hemoglobin: 17 g/dL (ref 13.0–17.0)
MCH: 31.6 pg (ref 26.0–34.0)
MCHC: 33.5 g/dL (ref 30.0–36.0)
MCV: 94.4 fL (ref 80.0–100.0)
Platelets: 182 10*3/uL (ref 150–400)
RBC: 5.38 MIL/uL (ref 4.22–5.81)
RDW: 12.6 % (ref 11.5–15.5)
WBC: 7 10*3/uL (ref 4.0–10.5)
nRBC: 0 % (ref 0.0–0.2)

## 2019-07-25 LAB — GLUCOSE, CAPILLARY
Glucose-Capillary: 73 mg/dL (ref 70–99)
Glucose-Capillary: 61 mg/dL — ABNORMAL LOW (ref 70–99)
Glucose-Capillary: 120 mg/dL — ABNORMAL HIGH (ref 70–99)
Glucose-Capillary: 65 mg/dL — ABNORMAL LOW (ref 70–99)
Glucose-Capillary: 71 mg/dL (ref 70–99)
Glucose-Capillary: 155 mg/dL — ABNORMAL HIGH (ref 70–99)

## 2019-07-25 LAB — TSH: TSH: 0.579 u[IU]/mL (ref 0.350–4.500)

## 2019-07-25 LAB — HIV ANTIBODY (ROUTINE TESTING W REFLEX): HIV Screen 4th Generation wRfx: NONREACTIVE

## 2019-07-25 MED ORDER — DEXTROSE 50 % IV SOLN: 12.5000 g | INTRAVENOUS | Status: AC

## 2019-07-25 MED ORDER — DEXTROSE-NACL 5-0.45 % IV SOLN: INTRAVENOUS | Status: DC

## 2019-07-25 MED ORDER — SIMETHICONE 80 MG PO CHEW
80.0000 mg | CHEWABLE_TABLET | Freq: Four times a day (QID) | ORAL | Status: AC
  Administered 2019-07-25 – 2019-07-26 (×4): 80 mg via ORAL
  Filled 2019-07-25 (×4): qty 1

## 2019-07-25 MED ORDER — DEXTROSE 50 % IV SOLN
INTRAVENOUS | Status: AC
  Administered 2019-07-25: 25 mL
  Filled 2019-07-25: qty 50

## 2019-07-25 NOTE — Progress Notes (Signed)
PROGRESS NOTE   Benjamin Moreno  NUU:[id number] DOB: 1956/01/09 DOA: 07/24/2019 PCP: Asencion Noble, MD   Chief Complaint  Patient presents with  . Abdominal Pain   Brief Admission History:  64 y.o. male with medical history significant of recurrent bowel obstructions, DM, GERD, HTN. Pt presenting w/ 1 mo h/o abd pain and increasing abd girth. Significant worsening over past 7 days. Pain and distension diffuse w/ localization fo mid abd region. Becoming more SOB as abd becomes more distended. Minimal PO intake due ot pain and full feeling. Eating makes the pain worse. Has had before and states this is his typical sx when he develops a bowel obstruction. No BM in the past week. Typically his bowels are hard to predict, stating that often he has problems holding in his stool when he feels the urge to go. No colonoscopy. No prior abd surgeries.   Assessment & Plan:   Active Problems:   HTN (hypertension)   DM type 2 (diabetes mellitus, type 2) ([facility])   AKI (acute kidney injury) ([place])   Bowel obstruction ([facility])   Anal warts   Chronic diarrhea   Dysphagia  1. Recurrent SBO - Pt reports that for many years he has had these recurrences and they are relieved with bowel rest.  Appreciate surgery consult and recommendations.  2. Hypoglycemia - added dextrose to IV fluids, follow CBG.  3. Hypernatremia - hypotonic fluids.  4. Rectal growth - GI consult appreciated, concern for anal warts.  Surgery consulted.  5. Type 2 DM - follow CBG, holding insulin due to low BS.  6. Essential hypertension - IV hydralazine as needed.  7. GERD - IV protonix ordered.  8. AKI - prerenal. Resolved now with IV fluids.   DVT prophylaxis: subcut heparin Code Status: full  Family Communication: wife at bedside  Disposition:   Status is: Inpatient  Remains inpatient appropriate because:Ongoing diagnostic testing needed not appropriate for outpatient work up, IV treatments appropriate due to intensity of illness or  inability to take PO and Inpatient level of care appropriate due to severity of illness  Dispo: The patient is from: Home              Anticipated d/c is to: Home              Anticipated d/c date is: 3 days              Patient currently is not medically stable to d/c.  Consultants:   Surgery  GI   Procedures:     Antimicrobials:     Subjective: Pt reports that he has flatus.   Objective: Vitals:   07/25/19 0213 07/25/19 0459 07/25/19 0739 07/25/19 1336  BP: 124/66 119/70  (!) 141/86  Pulse: 78 75  87  Resp: 17 17  18   Temp: 97.7 F (36.5 C) (!) 97.5 F (36.4 C)  98.8 F (37.1 C)  TempSrc: Oral Oral  Oral  SpO2: 97% 95% 97% 97%  Weight:      Height:        Intake/Output Summary (Last 24 hours) at 07/25/2019 1438 Last data filed at 07/25/2019 0900 Gross per 24 hour  Intake 1472.53 ml  Output --  Net 1472.53 ml   Filed Weights   07/24/19 1107  Weight: 108.9 kg   Examination:  General exam: Appears calm and comfortable  Respiratory system: Clear to auscultation. Respiratory effort normal. Cardiovascular system: normal S1 & S2 heard, RRR. No JVD, murmurs, rubs, gallops or  clicks. No pedal edema. Gastrointestinal system: Abdomen is mildly distended, soft and mild epigastric tenderness. No organomegaly or masses felt. Normal bowel sounds heard. Central nervous system: Alert and oriented. No focal neurological deficits. Extremities: Symmetric 5 x 5 power. Skin: No rashes, lesions or ulcers.  Psychiatry: Judgement and insight appear normal. Mood & affect appropriate.   Data Reviewed: I have personally reviewed following labs and imaging studies  CBC: Recent Labs  Lab 07/24/19 1257 07/25/19 0448  WBC 7.1 7.0  NEUTROABS 4.7  --   HGB 17.7* 17.0  HCT 52.1* 50.8  MCV 93.5 94.4  PLT 200 250    Basic Metabolic Panel: Recent Labs  Lab 07/24/19 1257 07/25/19 0448  NA 145 146*  K 5.2* 3.7  CL 108 111  CO2 26 22  GLUCOSE 134* 71  BUN 18 16   CREATININE 1.34* 1.09  CALCIUM 9.4 8.7*    GFR: Estimated Creatinine Clearance: 80.3 mL/min (by C-G formula based on SCr of 1.09 mg/dL).  Liver Function Tests: Recent Labs  Lab 07/24/19 1257 07/25/19 0448  AST 40 48*  ALT 34 38  ALKPHOS 69 61  BILITOT 0.9 0.8  PROT 7.7 7.1  ALBUMIN 4.0 3.6    CBG: Recent Labs  Lab 07/24/19 2139 07/25/19 0724 07/25/19 1107 07/25/19 1133  GLUCAP 89 73 61* 120*    Recent Results (from the past 240 hour(s))  SARS Coronavirus 2 by RT PCR (hospital order, performed in [facility] hospital lab) Nasopharyngeal Nasopharyngeal Swab     Status: None   Collection Time: 07/24/19  4:28 PM   Specimen: Nasopharyngeal Swab  Result Value Ref Range Status   SARS Coronavirus 2 NEGATIVE NEGATIVE Final    Comment: (NOTE) SARS-CoV-2 target nucleic acids are NOT DETECTED.  The SARS-CoV-2 RNA is generally detectable in upper and lower respiratory specimens during the acute phase of infection. The lowest concentration of SARS-CoV-2 viral copies this assay can detect is 250 copies / mL. A negative result does not preclude SARS-CoV-2 infection and should not be used as the sole basis for treatment or other patient management decisions.  A negative result may occur with improper specimen collection / handling, submission of specimen other than nasopharyngeal swab, presence of viral mutation(s) within the areas targeted by this assay, and inadequate number of viral copies (<250 copies / mL). A negative result must be combined with clinical observations, patient history, and epidemiological information.  Fact Sheet for Patients:   StrictlyIdeas.no  Fact Sheet for Healthcare Providers: BankingDealers.co.za  This test is not yet approved or  cleared by the [place] FDA and has been authorized for detection and/or diagnosis of SARS-CoV-2 by FDA under an Emergency Use Authorization (EUA).  This EUA will  remain in effect (meaning this test can be used) for the duration of the COVID-19 declaration under Section 564(b)(1) of the Act, 21 U.S.C. section 360bbb-3(b)(1), unless the authorization is terminated or revoked sooner.  Performed at [facility], [address] [place], [place] [address]      Radiology Studies: CT Abdomen Pelvis W Contrast  Result Date: 07/24/2019 CLINICAL DATA:  Abdominal pain for 1 week. EXAM: CT ABDOMEN AND PELVIS WITH CONTRAST TECHNIQUE: Multidetector CT imaging of the abdomen and pelvis was performed using the standard protocol following bolus administration of intravenous contrast. CONTRAST:  141mL OMNIPAQUE IOHEXOL 300 MG/ML  SOLN COMPARISON:  None. FINDINGS: Lower chest: No acute abnormality. Hepatobiliary: Low-attenuation of the liver as can be seen with hepatic steatosis. Normal gallbladder. No biliary  ductal dilatation. Pancreas: Unremarkable. No pancreatic ductal dilatation or surrounding inflammatory changes. Spleen: Normal in size without focal abnormality. Adrenals/Urinary Tract: Adrenal glands are unremarkable. Kidneys are normal, without renal calculi, focal lesion, or hydronephrosis. Bladder is unremarkable. Stomach/Bowel: Stomach is within normal limits. Dilated loops of small bowel measuring up to 3.9 cm with fluid-filled loops of bowel with a relative transition point in the right lower quadrant consistent with small bowel obstruction. 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 Vascular/Lymphatic: Normal caliber abdominal aorta with mild atherosclerosis. No lymphadenopathy. Reproductive: Prostate is unremarkable. Other: Small fat containing umbilical hernia. Musculoskeletal: No acute or significant osseous findings. IMPRESSION: 1. Dilated  fluid filled loops of small bowel measuring up to 3.9 cm with a relative transition point in the right lower quadrant consistent with small bowel obstruction. 2.  Aortic Atherosclerosis (ICD10-I70.0). Electronically Signed   By: Kathreen Devoid   On: 07/24/2019 15:05   DG Chest Portable 1 View  Result Date: 07/24/2019 CLINICAL DATA:  NG tube placement EXAM: PORTABLE CHEST 1 VIEW COMPARISON:  None. FINDINGS: NG tube coils in the fundus of the stomach. Lungs are clear. Heart is normal size. No effusions. IMPRESSION: NG tube coils in the fundus of the stomach. Electronically Signed   By: Rolm Baptise M.D.   On: 07/24/2019 18:27   DG Abd Portable 1V-Small Bowel Obstruction Protocol-initial, 8 hr delay  Result Date: 07/25/2019 CLINICAL DATA:  Small-bowel obstruction. EXAM: PORTABLE ABDOMEN - 1 VIEW COMPARISON:  CT scan 07/24/2019 FINDINGS: Persistent air distended small bowel loops in the central abdomen with transition to normal/decompressed distal small bowel loops. However, contrast has moved through the small-bowel and into the colon. Findings suggest partial small bowel obstruction. Contrast noted in the bladder from the recent CT scan. IMPRESSION: Findings consistent with a partial small bowel obstruction. Electronically Signed   By: Marijo Sanes M.D.   On: 07/25/2019 07:50   Scheduled Meds: . heparin  5,000 Units Subcutaneous Q8H  . ketorolac  60 mg Intramuscular Daily  . pantoprazole (PROTONIX) IV  40 mg Intravenous Q24H   Continuous Infusions: . dextrose 5 % and 0.45% NaCl 75 mL/hr at 07/25/19 1133     LOS: 1 day   Time spent: 20 mins   Jerika Wales Wynetta Emery, MD How to contact the [facility] Attending or Consulting provider [place] or covering provider during after hours [place], for this patient?  1. Check the care team in [facility] and look for a) attending/consulting [facility] provider listed and b) the [facility] team listed 2. Log into [email or web address] and use [place]'s universal password to access. If you do not  have the password, please contact the hospital operator. 3. Locate the [facility] provider you are looking for under [facility] and page to a number that you can be directly reached. 4. If you still have difficulty reaching the provider, please page the [facility] (Director on Call) for the Hospitalists listed on amion for assistance.  07/25/2019, 2:38 PM

## 2019-07-25 NOTE — Progress Notes (Signed)
Initial Nutrition Assessment  DOCUMENTATION CODES:   Obesity unspecified  INTERVENTION:  Follow for diet advancement    NUTRITION DIAGNOSIS:   Inadequate oral intake related to chronic illness (recurrent small bowel obstruction) as evidenced by  (NPO with NGT placed- surgery consult pending, GI following).  GOAL:  Patient will meet greater than or equal to 90% of their needs   MONITOR:  Diet advancement, Weight trends, Skin, I & O's   REASON FOR ASSESSMENT:   Malnutrition Screening Tool    ASSESSMENT: Patient is a 64 yo male with hx of recurrent small bowel obstructions (20 years), GERD, DM, and HTN. Chronic watery BM's  NPO status. Patient awake and family at bedside. He is feeling better but still having watery stools and distended abdomen. NGT in place- OP noted. Surgery to follow up with him in the morning per GI note- question rectal mass.   At home diet is regular and his usually eats 3 meals daily and snack. Patient has often managed his symptoms at home by reducing intake but  Increased abdominal pain persisting.   Weight: limited history available. Currently 239.8 lb (108.9 kg). May 2013 he weighed 117.7 kg. Patient self reports usual of 240-245 lb.   Medications reviewed and include: Protonix  Labs: BMP Latest Ref Rng & Units 07/25/2019 07/24/2019 06/09/2011  Glucose 70 - 99 mg/dL 71 134(H) 103(H)  BUN 8 - 23 mg/dL 16 18 5(L)  Creatinine 0.61 - 1.24 mg/dL 1.09 1.34(H) 0.85  Sodium 135 - 145 mmol/L 146(H) 145 138  Potassium 3.5 - 5.1 mmol/L 3.7 5.2(H) 3.5  Chloride 98 - 111 mmol/L 111 108 99  CO2 22 - 32 mmol/L 22 26 28   Calcium 8.9 - 10.3 mg/dL 8.7(L) 9.4 8.9     NUTRITION - FOCUSED PHYSICAL EXAM: Nutrition-Focused physical exam completed. Findings are no fat depletion, no muscle depletion, and no edema.     Diet Order:   Diet Order            Diet NPO time specified  Diet effective now                 EDUCATION NEEDS:  No education needs have been  identified at this time Skin:  Skin Assessment: Reviewed RN Assessment  Last BM:  6/29- watery  Height:   Ht Readings from Last 1 Encounters:  07/24/19 5\' 6"  (1.676 m)    Weight:   Wt Readings from Last 1 Encounters:  07/24/19 108.9 kg    Ideal Body Weight:   65 kg   BMI:  Body mass index is 38.74 kg/m.  Estimated Nutritional Needs:   Kcal:  2196-2379  Protein:  91-98 gr  Fluid:  2.2-2.4 liters daily  Colman Cater MS,RD,CSG,LDN Pager: Shea Evans

## 2019-07-25 NOTE — Progress Notes (Signed)
Hypoglycemic Event  CBG: 65  Treatment: 8oz ginger ale  Symptoms: asymptomatic  Follow-up CBG: Time: 1711 CBG Result: 71  Possible Reasons for Event: NPO Comments/MD notified: Dr. Letitia Caul made aware. Patient advanced to clear liquid diet    Benjamin Moreno

## 2019-07-25 NOTE — Consult Note (Signed)
Referring Provider: [facility]  Primary Care Physician:  Asencion Noble, MD Primary Gastroenterologist:  Dr. Oneida Alar historically.   Date of Admission: 07/24/19 Date of Consultation: 07/25/19  Reason for Consultation:  ?Rectal mass  HPI:  Benjamin Moreno is a 64 y.o. year old male with medical history significant of recurrent bowel obstructions, DM, GERD, HTN. Patient presented on 6/28 with 1 mo h/o abd pain and increasing abd girth. Significant worsening over past 7 days. Pain and distension diffuse w/ localization fo mid abd region. Becoming more SOB as abd becomes more distended. Eating worsens pain.   CT A/P with contrast revealed dilated fluid filled loops of small bowel measuring up to 3.9 cm with a relative transition point in the right lower quadrant consistent with small bowel obstruction. CBC essentially normal. CMP with potassium 5.2, creatinine 1.34. Lipase wnl. General surgery was consulted, recommended placement of NG tube and they would see patient in the AM.   Patient also reported a rectal mass that has not been bothering him. Dr. Sabra Heck completed rectal exam in the ED and reported a fungating warty tissue around patient's rectum. Hospitalists consulted GI to evaluate rectal mass.   Today:  Years of what he suspects has been recurrent SBOs. States he knows how to treat them at home. He will stop eating and symptoms resolve on their own. Typical symptoms include abdominal distension, pain, decreased BMs, nausea, and vomiting. Will stop eating for a couple of days and symptoms resolve. Reports chronic abdominal pain that is present all the time in the mid abdomen. This never really resolves completely. States he just got used to it. Reports no prior abdominal surgeries.   Feels a lot better today than he did yesterday. Continues with abdominal pain about 5/10 with movement or pressing on his abdomen. Generalized. No N/V. Has some emesis yesterday x1, no hematemesis. Passed small  liquid stool this morning.  No blood or black stool. He did have a small amount of blood on the toilet tissue. Reports this chronic. Has been intermittent for years.  Passing gas intermittently.  Rectal mass has been present for years at least 20 years. No significant change. Large. No pain typically. Bleeds intermittently.   Typically 5-8 watery BMs daily. Chronic-present for years. States he has to rush to the bathroom. Abdominal cramping prior to BMs that resolve thereafter. No nocturnal stools. Doesn't drink milk, occasional cheese. No antibiotics or hospitalizations recently. No blood in the stool or melena.   No prior colonoscopy. No unintentional weight loss.   Takes Protonix 40 mg daily. This controls reflux well. Occasional solid food dysphagia. Maybe once a month. Not often as long as he takes Protonix. Has had to regurgitate food in the past.  No trouble with soft foods or liquids.  No NSAIDs.   No family history of IBD or colon cancer. Mother, maternal GM, and maternal uncles with stomach cancer.    Past Medical History:  Diagnosis Date  . Bowel obstruction ([place])   . Diabetes mellitus   . GERD (gastroesophageal reflux disease)   . Hypertension     Past Surgical History:  Procedure Laterality Date  . CATARACT EXTRACTION      Prior to Admission medications   Medication Sig Start Date End Date Taking? Authorizing Provider  amLODipine (NORVASC) 10 MG tablet Take 10 mg by mouth daily.   Yes [provider]  atorvastatin (LIPITOR) 20 MG tablet Take 20 mg by mouth daily. 07/19/19  Yes [provider]  glimepiride (  AMARYL) 2 MG tablet Take 2 mg by mouth every morning. 07/07/19  Yes [provider]  INVOKANA 300 MG TABS tablet Take 300 mg by mouth daily. 07/19/19  Yes [provider]  LANTUS SOLOSTAR 100 UNIT/ML Solostar Pen Inject 45 Units into the skin every morning. 06/27/19  Yes [provider]  metFORMIN (GLUCOPHAGE) 500 MG tablet  Take 500 mg by mouth 2 (two) times daily.   Yes [provider]  metoprolol succinate (TOPROL-XL) 100 MG 24 hr tablet Take 100 mg by mouth daily. Take with or immediately following a meal.   Yes [provider]  pantoprazole (PROTONIX) 40 MG tablet Take 40 mg by mouth daily.   Yes [provider]  ramipril (ALTACE) 10 MG capsule Take 10 mg by mouth daily. 07/19/19  Yes [provider]    Current Facility-Administered Medications  Medication Dose Route Frequency Provider Last Rate Last Admin  . 0.45 % sodium chloride infusion   Intravenous Continuous Waldemar Dickens, MD 75 mL/hr at 07/24/19 2039 New Bag at 07/24/19 2039  . diphenhydrAMINE (BENADRYL) injection 25 mg  25 mg Intravenous QHS PRN Waldemar Dickens, MD   25 mg at 07/24/19 2118  . heparin injection 5,000 Units  5,000 Units Subcutaneous Q8H Waldemar Dickens, MD   5,000 Units at 07/25/19 6283  . hydrALAZINE (APRESOLINE) injection 5-10 mg  5-10 mg Intravenous Q4H PRN Waldemar Dickens, MD      . insulin aspart (novoLOG) injection 0-9 Units  0-9 Units Subcutaneous TID WC Waldemar Dickens, MD      . ketorolac (TORADOL) 30 MG/ML injection 60 mg  60 mg Intramuscular Daily Waldemar Dickens, MD   60 mg at 07/24/19 2059  . morphine 2 MG/ML injection 1 mg  1 mg Intravenous Q4H PRN Oswald Hillock, MD   1 mg at 07/24/19 2031  . ondansetron (ZOFRAN) tablet 4 mg  4 mg Oral Q6H PRN Waldemar Dickens, MD       Or  . ondansetron [facility]) injection 4 mg  4 mg Intravenous Q6H PRN Waldemar Dickens, MD      . pantoprazole (PROTONIX) injection 40 mg  40 mg Intravenous Q24H Waldemar Dickens, MD   40 mg at 07/24/19 2117    Allergies as of 07/24/2019  . (No Known Allergies)    Family History  Problem Relation Age of Onset  . Cancer Mother   . Heart disease Father     Social History   Socioeconomic History  . Marital status: Married    Spouse name: Not on file  . Number of children: Not on file  . Years of  education: Not on file  . Highest education level: Not on file  Occupational History  . Not on file  Tobacco Use  . Smoking status: Never Smoker  . Smokeless tobacco: Never Used  Vaping Use  . Vaping Use: Never used  Substance and Sexual Activity  . Alcohol use: No  . Drug use: No  . Sexual activity: Yes  Other Topics Concern  . Not on file  Social History Narrative  . Not on file   Social Determinants of Health   Financial Resource Strain:   . Difficulty of Paying Living Expenses:   Food Insecurity:   . Worried About Charity fundraiser in the Last Year:   . Arboriculturist in the Last Year:   Transportation Needs:   . Film/video editor (Medical):   Marland Kitchen  Lack of Transportation (Non-Medical):   Physical Activity:   . Days of Exercise per Week:   . Minutes of Exercise per Session:   Stress:   . Feeling of Stress :   Social Connections:   . Frequency of Communication with Friends and Family:   . Frequency of Social Gatherings with Friends and Family:   . Attends Religious Services:   . Active Member of Clubs or Organizations:   . Attends Archivist Meetings:   Marland Kitchen Marital Status:   Intimate Partner Violence:   . Fear of Current or Ex-Partner:   . Emotionally Abused:   Marland Kitchen Physically Abused:   . Sexually Abused:     Review of Systems: Gen: Denies fever, chills, cold or flulike symptoms.  Admits to chronic intermittent lightheadedness.  Attributes this to her blood pressure. CV: Denies chest pain or heart palpitations  Resp: SOB with exertion. No cough.  GI: See HPI GU : Denies urinary burning, urinary frequency, urinary incontinence.  MS: Denies joint pain Derm: Denies rash Psych: Denies depression or anxiety. Heme: See HPI  Physical Exam: Vital signs in last 24 hours: Temp:  [97.5 F (36.4 C)-98.7 F (37.1 C)] 97.5 F (36.4 C) (06/29 0459) Pulse Rate:  [75-87] 75 (06/29 0459) Resp:  [16-18] 17 (06/29 0459) BP: (114-145)/(49-96) 119/70 (06/29  0459) SpO2:  [93 %-97 %] 95 % (06/29 0459) Weight:  [108.9 kg] 108.9 kg (06/28 1107) Last BM Date: 07/17/19 General:   Alert,  Well-developed, well-nourished, pleasant and cooperative in NAD.  NG tube in place.  Small amount of green gastric fluid in collection container. Head:  Normocephalic and atraumatic. Eyes:  Sclera clear, no icterus.   Conjunctiva pink. Ears:  Normal auditory acuity. Lungs:  Clear throughout to auscultation.   No wheezes, crackles, or rhonchi. No acute distress. Heart:  Regular rate and rhythm; no murmurs, clicks, rubs,  or gallops. Abdomen: Moderately distended but soft abdomen.  Mild generalized tenderness to palpation.  Somewhat decreased but bowel sounds present in all 4 quadrants.  No masses, hepatosplenomegaly or hernias noted.  No guarding or rebound.    Rectal:  Extensive warty like tissue around patients anus. Internal exam with no appreciable masses or hemorrhoids.     Msk:  Symmetrical without gross deformities. Normal posture. Pulses:  Normal pulses noted. Extremities:  Without edema. Neurologic:  Alert and  oriented x4;  grossly normal neurologically. Skin:  Intact without significant lesions or rashes. Psych: Normal mood and affect.  Intake/Output from previous day: 06/28 0701 - 06/29 0700 In: 1472.5 [I.V.:472.5; IV Piggyback:1000] Out: -  Intake/Output this shift: No intake/output data recorded.  Lab Results: Recent Labs    07/24/19 1257 07/25/19 0448  WBC 7.1 7.0  HGB 17.7* 17.0  HCT 52.1* 50.8  PLT 200 182   BMET Recent Labs    07/24/19 1257 07/25/19 0448  NA 145 146*  K 5.2* 3.7  CL 108 111  CO2 26 22  GLUCOSE 134* 71  BUN 18 16  CREATININE 1.34* 1.09  CALCIUM 9.4 8.7*   LFT Recent Labs    07/24/19 1257 07/25/19 0448  PROT 7.7 7.1  ALBUMIN 4.0 3.6  AST 40 48*  ALT 34 38  ALKPHOS 69 61  BILITOT 0.9 0.8   Studies/Results: CT Abdomen Pelvis W Contrast  Result Date: 07/24/2019 CLINICAL DATA:  Abdominal pain for 1  week. EXAM: CT ABDOMEN AND PELVIS WITH CONTRAST TECHNIQUE: Multidetector CT imaging of the abdomen and pelvis was performed using the  standard protocol following bolus administration of intravenous contrast. CONTRAST:  128mL OMNIPAQUE IOHEXOL 300 MG/ML  SOLN COMPARISON:  None. FINDINGS: Lower chest: No acute abnormality. Hepatobiliary: Low-attenuation of the liver as can be seen with hepatic steatosis. Normal gallbladder. No biliary ductal dilatation. Pancreas: Unremarkable. No pancreatic ductal dilatation or surrounding inflammatory changes. Spleen: Normal in size without focal abnormality. Adrenals/Urinary Tract: Adrenal glands are unremarkable. Kidneys are normal, without renal calculi, focal lesion, or hydronephrosis. Bladder is unremarkable. Stomach/Bowel: Stomach is within normal limits. Dilated loops of small bowel measuring up to 3.9 cm with fluid-filled loops of bowel with a relative transition point in the right lower quadrant consistent with small bowel obstruction. 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 Vascular/Lymphatic: Normal caliber abdominal aorta with mild atherosclerosis. No lymphadenopathy. Reproductive: Prostate is unremarkable. Other: Small fat containing umbilical hernia. Musculoskeletal: No acute or significant osseous findings. IMPRESSION: 1. Dilated fluid filled loops of small bowel measuring up to 3.9 cm with a relative transition point in the right lower quadrant consistent with small bowel obstruction. 2.  Aortic Atherosclerosis (ICD10-I70.0). Electronically Signed   By: Kathreen Devoid   On: 07/24/2019 15:05   DG Chest Portable 1 View  Result Date: 07/24/2019 CLINICAL DATA:  NG tube placement EXAM: PORTABLE CHEST 1 VIEW COMPARISON:  None. FINDINGS: NG tube  coils in the fundus of the stomach. Lungs are clear. Heart is normal size. No effusions. IMPRESSION: NG tube coils in the fundus of the stomach. Electronically Signed   By: Rolm Baptise M.D.   On: 07/24/2019 18:27    Impression: 64 year old male with medical history significant for recurrent bowel obstructions, DM, GERD, HTN who has been admitted with small bowel obstruction.  Also noted rectal mass and GI was consulted.  Small bowel obstruction: CT A/P with contrast with dilated fluid-filled loops of small bowel with relative transition point in the right lower quadrant consistent with SBO.  CBC essentially normal, CMP with potassium 5.2, creatinine 1.34, lipase within normal limits.  General surgery has been consulted and recommended NG tube placement with plans to consult in the a.m.  NG tube is in place and patient feels he is improving.  N/V resolved.  Continues with mild generalized abdominal pain.  One small liquid BM this morning.  Passing gas. Will follow surgeries lead. Query whether he has a component of chronic small bowel obstruction as he reports chronic abdominal pain and liquid stools.   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  Considering the extent, suspect patient is likely going to need some sort of surgical excision etc. Patient will discuss with surgery when they come by today.   Chronic diarrhea: Patient reports several year history of watery diarrhea with 5-8 BMs daily.  Denies blood in the stool or melena although he does note toilet tissue hematochezia secondary to likely anal warts.  No prior colonoscopy.  Interestingly, patient has recurrent small bowel obstructions.  Query whether patient has some sort of chronic small bowel obstruction leading to overflow diarrhea.  As he also has chronic abdominal pain, cannot rule out IBD. Some cramping in the lower abdomen that improves after a BM suggest possible IBS. Additional  differentials include celiac disease, thyroid abnormalities, food intolerances, pancreatic insuffiencey secondary to DM, and less likely infectious  diarrhea. Recommend we go ahead and check TSH, celiac serologies, and infectious causes if diarrhea returns while inpatient. He will need further evaluation as an outpatient including first ever colonoscopy.   Dysphagia: Patient reports chronic history of intermittent solid food dysphagia typically occurring about once a month.  Has required food regurgitation in the past.  GERD symptoms fairly well controlled on Protonix 40 mg daily.  Suspect he may have esophageal web, ring, or stricture secondary to chronic GERD. Less likely malignancy.  He would benefit from EGD/ED which will likely be completed in outpatient setting considering SBO.   Plan: Appreciate general surgery input. Keep NPO and NG tube in place.  Will need surgical input on anal warts. TTG IgA, IgA total TSH Patient will need first ever colonoscopy as an outpatient. Patient will need EGD/ED likely as in outpatient.  Would likely try to avoid inpatient as anesthesia could potentially create ileus. Continue PPI daily.       LOS: 1 day    07/25/2019, 7:33 AM   Aliene Altes, PA-C [facility]

## 2019-07-25 NOTE — Progress Notes (Signed)
Hypoglycemic Event  CBG: 61  Treatment: 37mL D50  Symptoms: asymptomatic  Follow-up CBG: Time: 1134 CBG Result: 120  Possible Reasons for Event: NPO  Comments/MD notified: Dr. Jamesetta Geralds

## 2019-07-25 NOTE — Consult Note (Signed)
[name] Consult  Reason for Consult: ? SBO, anal condyloma  Referring Physician:  Dr. Wynetta Emery   Chief Complaint    Abdominal Pain      HPI: Benjamin Moreno is a 64 y.o. male with a history of DM, HTN, GERD who reports chronic issues with abdominal pain, chronic liquid diarrhea and even incontinence and worsening abdominal pain in the last 7 days with nausea and worsening pain with eating. He says he chronically has pain but it is not flared by any particular food. He has come into the hospital 3 other times in the past for CT scan imaging concerning for possible obstruction but he has never had any surgery. 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 He says that between these hospital visits he has chronic daily abdominal pain but has learned to live with it, and says that normal has as BM multiple times a day that is diarrhea.  He had an NG placed overnight and SBFT done that has demonstrated contrast into the colon.  He reports multiple liquid BMs since this AM.  He also reports having a rectal mass for over 20 years. He reports some rectal bleeding. He says that he has never had a colonoscopy or endoscopy.   Past Medical History:  Diagnosis Date  . Bowel obstruction ([place])   . Diabetes mellitus   . GERD (gastroesophageal reflux disease)   . Hypertension     Past Surgical History:  Procedure Laterality Date  . CATARACT EXTRACTION      Family History  Problem Relation Age of Onset  . Cancer Mother   . Stomach cancer Mother   . Heart disease Father   . Bowel Disease Paternal Grandmother        Patient isn't sure the etiology. Required colostomy.   . Stomach cancer Maternal Uncle        several uncles with stomach cancer  . Colon cancer Neg Hx   . Inflammatory bowel disease Neg Hx     Social History   Tobacco Use  . Smoking status: Never Smoker  . Smokeless tobacco: Never Used    Vaping Use  . Vaping Use: Never used  Substance Use Topics  . Alcohol use: No  . Drug use: No    Medications: I have reviewed the patient's current medications. Current Facility-Administered Medications  Medication Dose Route Frequency Provider Last Rate Last Admin  . dextrose 5 %-0.45 % sodium chloride infusion   Intravenous Continuous Johnson, Clanford L, MD 75 mL/hr at 07/25/19 1600 Rate Verify at 07/25/19 1600  . dextrose 50 % solution 12.5 g  12.5 g Intravenous STAT Johnson, Clanford L, MD      . diphenhydrAMINE (BENADRYL) injection 25 mg  25 mg Intravenous QHS PRN Waldemar Dickens, MD   25 mg at 07/24/19 2118  . heparin injection 5,000 Units  5,000 Units Subcutaneous Q8H Waldemar Dickens, MD   5,000 Units at 07/25/19 1726  . hydrALAZINE (APRESOLINE) injection 5-10 mg  5-10 mg Intravenous Q4H PRN Waldemar Dickens, MD      . ketorolac (TORADOL) 30 MG/ML injection 60 mg  60 mg Intramuscular Daily Waldemar Dickens, MD   60 mg at 07/25/19 1023  . morphine 2 MG/ML injection 1 mg  1 mg Intravenous Q4H PRN Oswald Hillock, MD   1 mg at 07/24/19 2031  . ondansetron (ZOFRAN) tablet 4 mg  4 mg Oral  Q6H PRN Waldemar Dickens, MD       Or  . ondansetron [facility]) injection 4 mg  4 mg Intravenous Q6H PRN Waldemar Dickens, MD      . pantoprazole (PROTONIX) injection 40 mg  40 mg Intravenous Q24H Waldemar Dickens, MD   40 mg at 07/24/19 2117    No Known Allergies   ROS:  A comprehensive review of systems was negative except for: Gastrointestinal: positive for abdominal pain, diarrhea, nausea, vomiting and distention, chronic diarrhea, rectal bleeding/ rectal mass  Blood pressure (!) 141/86, pulse 87, temperature 98.8 F (37.1 C), temperature source Oral, resp. rate 18, height 5\' 6"  (1.676 m), weight 108.9 kg, SpO2 97 %. Physical Exam Vitals reviewed.  Constitutional:      Appearance: He is obese.  HENT:     Head: Normocephalic and atraumatic.  Eyes:     Extraocular Movements: Extraocular  movements intact.  Cardiovascular:     Rate and Rhythm: Normal rate and regular rhythm.  Pulmonary:     Effort: Pulmonary effort is normal.  Abdominal:     General: There is distension.     Palpations: Abdomen is soft.     Tenderness: There is no abdominal tenderness.  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  Neurological:     General: No focal deficit present.     Mental Status: He is alert and oriented to person, place, and time.  Psychiatric:        Mood and Affect: Mood normal.        Behavior: Behavior normal.       Results: Results for orders placed or performed during the hospital encounter of 07/24/19 (from the past 48 hour(s))  CBC with Differential     Status: Abnormal   Collection Time: 07/24/19 12:57 PM  Result Value Ref Range   WBC 7.1 4.0 - 10.5 K/uL   RBC 5.57 4.22 - 5.81 MIL/uL   Hemoglobin 17.7 (H) 13.0 - 17.0 g/dL   HCT 52.1 (H) 39 - 52 %   MCV 93.5 80.0 - 100.0 fL   MCH 31.8 26.0 - 34.0 pg   MCHC 34.0 30.0 - 36.0 g/dL   RDW 12.9 11.5 - 15.5 %   Platelets 200 150 - 400 K/uL   nRBC 0.0 0.0 - 0.2 %   Neutrophils Relative % 67 %   Neutro Abs 4.7 1.7 - 7.7 K/uL   Lymphocytes Relative 21 %   Lymphs Abs 1.5 0.7 - 4.0 K/uL   Monocytes Relative 10 %   Monocytes Absolute 0.7 0 - 1 K/uL   Eosinophils Relative 1 %   Eosinophils Absolute 0.1 0 - 0 K/uL   Basophils Relative 1 %   Basophils Absolute 0.1 0 - 0 K/uL   Immature Granulocytes 0 %   Abs Immature Granulocytes 0.02 0.00 - 0.07 K/uL    Comment: Performed at [facility], [address], [place], [place] [address]  Comprehensive metabolic panel     Status: Abnormal   Collection Time: 07/24/19 12:57 PM  Result Value Ref Range   Sodium 145 135 - 145 mmol/L   Potassium 5.2 (H) 3.5 - 5.1 mmol/L   Chloride 108 98 - 111 mmol/L   CO2 26 22 - 32 mmol/L   Glucose, Bld 134 (H)  70 - 99 mg/dL    Comment: Glucose reference range applies  only to samples taken after fasting for at least 8 hours.   BUN 18 8 - 23 mg/dL   Creatinine, Ser 1.34 (H) 0.61 - 1.24 mg/dL   Calcium 9.4 8.9 - 10.3 mg/dL   Total Protein 7.7 6.5 - 8.1 g/dL   Albumin 4.0 3.5 - 5.0 g/dL   AST 40 15 - 41 U/L   ALT 34 0 - 44 U/L   Alkaline Phosphatase 69 38 - 126 U/L   Total Bilirubin 0.9 0.3 - 1.2 mg/dL   GFR calc non Af Amer 56 (L) >60 mL/min   GFR calc Af Amer >60 >60 mL/min   Anion gap 11 5 - 15    Comment: Performed at [facility], [address] [place], [place] [address]  Lipase, blood     Status: None   Collection Time: 07/24/19 12:57 PM  Result Value Ref Range   Lipase 33 11 - 51 U/L    Comment: Performed at [facility], [address], [place], [place] [address]  HIV Antibody (routine testing w rflx)     Status: None   Collection Time: 07/24/19 12:57 PM  Result Value Ref Range   HIV Screen 4th Generation wRfx Non Reactive Non Reactive    Comment: Performed at [facility], [place] [address] [place], [place] [address]  Urinalysis, Routine w reflex microscopic     Status: Abnormal   Collection Time: 07/24/19  1:36 PM  Result Value Ref Range   Color, Urine YELLOW YELLOW   APPearance HAZY (A) CLEAR   Specific Gravity, Urine 1.029 1.005 - 1.030   pH 5.0 5.0 - 8.0   Glucose, UA >=500 (A) NEGATIVE mg/dL   Hgb urine dipstick NEGATIVE NEGATIVE   Bilirubin Urine NEGATIVE NEGATIVE   Ketones, ur NEGATIVE NEGATIVE mg/dL   Protein, ur NEGATIVE NEGATIVE mg/dL   Nitrite NEGATIVE NEGATIVE   Leukocytes,Ua NEGATIVE NEGATIVE   WBC, UA 0-5 0 - 5 WBC/hpf   Bacteria, UA NONE SEEN NONE SEEN   Mucus PRESENT     Comment: Performed at [facility], [address] [place], [place] [address]  SARS Coronavirus 2 by RT PCR (hospital order, performed in [place] hospital lab) Nasopharyngeal Nasopharyngeal Swab     Status: None   Collection Time: 07/24/19  4:28 PM   Specimen: Nasopharyngeal Swab    Result Value Ref Range   SARS Coronavirus 2 NEGATIVE NEGATIVE    Comment: (NOTE) SARS-CoV-2 target nucleic acids are NOT DETECTED.  The SARS-CoV-2 RNA is generally detectable in upper and lower respiratory specimens during the acute phase of infection. The lowest concentration of SARS-CoV-2 viral copies this assay can detect is 250 copies / mL. A negative result does not preclude SARS-CoV-2 infection and should not be used as the sole basis for treatment or other patient management decisions.  A negative result may occur with improper specimen collection / handling, submission of specimen other than nasopharyngeal swab, presence of viral mutation(s) within the areas targeted by this assay, and inadequate number of viral copies (<250 copies / mL). A negative result must be combined with clinical observations, patient history, and epidemiological information.  Fact Sheet for Patients:   StrictlyIdeas.no  Fact Sheet for Healthcare Providers: BankingDealers.co.za  This test is not yet approved or  cleared by the [place] FDA and has been authorized for detection and/or diagnosis of SARS-CoV-2 by FDA under an Emergency Use Authorization (EUA).  This EUA will remain in effect (meaning this test can be used)  for the duration of the COVID-19 declaration under Section 564(b)(1) of the Act, 21 U.S.C. section 360bbb-3(b)(1), unless the authorization is terminated or revoked sooner.  Performed at [facility], [address], [place], [place] [address]   Glucose, capillary     Status: None   Collection Time: 07/24/19  9:39 PM  Result Value Ref Range   Glucose-Capillary 89 70 - 99 mg/dL    Comment: Glucose reference range applies only to samples taken after fasting for at least 8 hours.   Comment 1 Notify RN   CBC     Status: None   Collection Time: 07/25/19  4:48 AM  Result Value Ref Range   WBC 7.0 4.0 - 10.5 K/uL   RBC 5.38 4.22 -  5.81 MIL/uL   Hemoglobin 17.0 13.0 - 17.0 g/dL   HCT 50.8 39 - 52 %   MCV 94.4 80.0 - 100.0 fL   MCH 31.6 26.0 - 34.0 pg   MCHC 33.5 30.0 - 36.0 g/dL   RDW 12.6 11.5 - 15.5 %   Platelets 182 150 - 400 K/uL   nRBC 0.0 0.0 - 0.2 %    Comment: Performed at [facility], [address] [place], [place] [address]  Comprehensive metabolic panel     Status: Abnormal   Collection Time: 07/25/19  4:48 AM  Result Value Ref Range   Sodium 146 (H) 135 - 145 mmol/L   Potassium 3.7 3.5 - 5.1 mmol/L    Comment: DELTA CHECK NOTED   Chloride 111 98 - 111 mmol/L   CO2 22 22 - 32 mmol/L   Glucose, Bld 71 70 - 99 mg/dL    Comment: Glucose reference range applies only to samples taken after fasting for at least 8 hours.   BUN 16 8 - 23 mg/dL   Creatinine, Ser 1.09 0.61 - 1.24 mg/dL   Calcium 8.7 (L) 8.9 - 10.3 mg/dL   Total Protein 7.1 6.5 - 8.1 g/dL   Albumin 3.6 3.5 - 5.0 g/dL   AST 48 (H) 15 - 41 U/L   ALT 38 0 - 44 U/L   Alkaline Phosphatase 61 38 - 126 U/L   Total Bilirubin 0.8 0.3 - 1.2 mg/dL   GFR calc non Af Amer >60 >60 mL/min   GFR calc Af Amer >60 >60 mL/min   Anion gap 13 5 - 15    Comment: Performed at [facility], [address] [place], [place] [address]  Glucose, capillary     Status: None   Collection Time: 07/25/19  7:24 AM  Result Value Ref Range   Glucose-Capillary 73 70 - 99 mg/dL    Comment: Glucose reference range applies only to samples taken after fasting for at least 8 hours.  Glucose, capillary     Status: Abnormal   Collection Time: 07/25/19 11:07 AM  Result Value Ref Range   Glucose-Capillary 61 (L) 70 - 99 mg/dL    Comment: Glucose reference range applies only to samples taken after fasting for at least 8 hours.  Glucose, capillary     Status: Abnormal   Collection Time: 07/25/19 11:33 AM  Result Value Ref Range   Glucose-Capillary 120 (H) 70 - 99 mg/dL    Comment: Glucose reference range applies only to samples taken after fasting for at least 8 hours.  TSH      Status: None   Collection Time: 07/25/19  1:33 PM  Result Value Ref Range   TSH 0.579 0.350 - 4.500 uIU/mL  Comment: Performed by a 3rd Generation assay with a functional sensitivity of <=0.01 uIU/mL. Performed at [facility], [address] [place], [place] [address]   Glucose, capillary     Status: Abnormal   Collection Time: 07/25/19  4:32 PM  Result Value Ref Range   Glucose-Capillary 65 (L) 70 - 99 mg/dL    Comment: Glucose reference range applies only to samples taken after fasting for at least 8 hours.  Glucose, capillary     Status: None   Collection Time: 07/25/19  5:11 PM  Result Value Ref Range   Glucose-Capillary 71 70 - 99 mg/dL    Comment: Glucose reference range applies only to samples taken after fasting for at least 8 hours.   Personally reviewed CT and reviewed prior CT with Dr. Thornton Papas in relation to today's CT.  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 EXAM: CT ABDOMEN AND PELVIS WITH CONTRAST TECHNIQUE: Multidetector CT imaging of the abdomen and pelvis was performed using the standard protocol following bolus administration of intravenous contrast. CONTRAST:  158mL OMNIPAQUE IOHEXOL 300 MG/ML  SOLN COMPARISON:  None. FINDINGS: Lower chest: No acute abnormality. Hepatobiliary: Low-attenuation of the liver as can be seen with hepatic steatosis. Normal gallbladder. No biliary ductal dilatation. Pancreas: Unremarkable. No pancreatic ductal dilatation or surrounding inflammatory changes. Spleen: Normal in size without focal abnormality. Adrenals/Urinary  Tract: Adrenal glands are unremarkable. Kidneys are normal, without renal calculi, focal lesion, or hydronephrosis. Bladder is unremarkable. Stomach/Bowel: Stomach is within normal limits. Dilated loops of small bowel measuring up to 3.9 cm with fluid-filled loops of bowel with a relative transition point in the right lower quadrant consistent with small bowel obstruction. 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 Vascular/Lymphatic: Normal caliber abdominal aorta with mild atherosclerosis. No lymphadenopathy. Reproductive: Prostate is unremarkable. Other: Small fat containing umbilical hernia. Musculoskeletal: No acute or significant osseous findings. IMPRESSION: 1. Dilated fluid filled loops of small bowel measuring up to 3.9 cm with a relative transition point in the right lower quadrant consistent with small bowel obstruction. 2.  Aortic Atherosclerosis (ICD10-I70.0). Electronically Signed   By: Kathreen Devoid   On: 07/24/2019 15:05   DG Chest Portable 1 View  Result Date: 07/24/2019 CLINICAL DATA:  NG tube placement EXAM: PORTABLE CHEST 1 VIEW COMPARISON:  None. FINDINGS: NG tube coils in the fundus of the stomach. Lungs are clear. Heart is normal size. No effusions. IMPRESSION: NG tube coils in the fundus of the stomach. Electronically Signed   By: Rolm Baptise M.D.   On: 07/24/2019 18:27   DG Abd Portable 1V-Small Bowel Obstruction Protocol-initial, 8 hr delay  Result Date: 07/25/2019 CLINICAL DATA:  Small-bowel obstruction. EXAM: PORTABLE ABDOMEN - 1 VIEW COMPARISON:  CT scan 07/24/2019 FINDINGS: Persistent air distended small bowel loops in the central abdomen with transition to normal/decompressed distal small bowel loops. However, contrast has moved through the small-bowel and  into the colon. Findings suggest  partial small bowel obstruction. Contrast noted in the bladder from the recent CT scan. IMPRESSION: Findings consistent with a partial small bowel obstruction. Electronically Signed   By: Marijo Sanes M.D.   On: 07/25/2019 07:50     Assessment & Plan:  Benjamin Moreno is a 64 y.o. male with obstruction but may be more pseudo-obstruction from thickened small bowel of unknown etiology given the thickening and hyperemia on the CT scans especially in 2011. He has chronic diarrhea at baseline and fecalization of some loops of bowel and no clear transition zone or reasons for adhesions.    NG out and Clears started. Adv as tolerated.   Discussed case with Dr. Laural Golden and patient, and think patient needs work up for reasons for primary enteritis causing this obstruction.   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 He will need referral to Colorectal for specialized care and treatment/ monitoring. Will need colonoscopy. Warned patient that these could already harbor dysplasia or cancer.   No urgent need for surgical intervention. If GI workup is inconclusive may need an outpatient exploration in the future, but would start with thorough GI evaluation.   All questions were answered to the satisfaction of the patient and family.  Updated Dr. Wynetta Emery.   Virl Cagey 07/25/2019, 5:22 PM

## 2019-07-26 LAB — BASIC METABOLIC PANEL
Anion gap: 11 (ref 5–15)
BUN: 7 mg/dL — ABNORMAL LOW (ref 8–23)
CO2: 26 mmol/L (ref 22–32)
Calcium: 8.7 mg/dL — ABNORMAL LOW (ref 8.9–10.3)
Chloride: 102 mmol/L (ref 98–111)
Creatinine, Ser: 0.99 mg/dL (ref 0.61–1.24)
GFR calc Af Amer: >60 mL/min (ref >60)
GFR calc non Af Amer: >60 mL/min (ref >60)
Glucose, Bld: 97 mg/dL (ref 70–99)
Potassium: 3.8 mmol/L (ref 3.5–5.1)
Sodium: 139 mmol/L (ref 135–145)

## 2019-07-26 LAB — HEMOGLOBIN A1C
Hgb A1c MFr Bld: 9 % — ABNORMAL HIGH (ref 4.8–5.6)
Mean Plasma Glucose: 212 mg/dL

## 2019-07-26 LAB — CBC WITH DIFFERENTIAL/PLATELET
Abs Immature Granulocytes: 0.04 10*3/uL (ref 0.00–0.07)
Basophils Absolute: 0.1 10*3/uL (ref 0.0–0.1)
Basophils Relative: 1 %
Eosinophils Absolute: 0.2 10*3/uL (ref 0.0–0.5)
Eosinophils Relative: 4 %
HCT: 48.8 % (ref 39.0–52.0)
Hemoglobin: 16.8 g/dL (ref 13.0–17.0)
Immature Granulocytes: 1 %
Lymphocytes Relative: 31 %
Lymphs Abs: 1.9 10*3/uL (ref 0.7–4.0)
MCH: 31.8 pg (ref 26.0–34.0)
MCHC: 34.4 g/dL (ref 30.0–36.0)
MCV: 92.2 fL (ref 80.0–100.0)
Monocytes Absolute: 0.6 10*3/uL (ref 0.1–1.0)
Monocytes Relative: 10 %
Neutro Abs: 3.2 10*3/uL (ref 1.7–7.7)
Neutrophils Relative %: 53 %
Platelets: 179 10*3/uL (ref 150–400)
RBC: 5.29 MIL/uL (ref 4.22–5.81)
RDW: 12.6 % (ref 11.5–15.5)
WBC: 6 10*3/uL (ref 4.0–10.5)
nRBC: 0 % (ref 0.0–0.2)

## 2019-07-26 LAB — SEDIMENTATION RATE: Sed Rate: 13 mm/hr (ref 0–16)

## 2019-07-26 LAB — GLUCOSE, CAPILLARY
Glucose-Capillary: 95 mg/dL (ref 70–99)
Glucose-Capillary: 172 mg/dL — ABNORMAL HIGH (ref 70–99)
Glucose-Capillary: 105 mg/dL — ABNORMAL HIGH (ref 70–99)
Glucose-Capillary: 182 mg/dL — ABNORMAL HIGH (ref 70–99)

## 2019-07-26 LAB — TISSUE TRANSGLUTAMINASE, IGA: Tissue Transglutaminase Ab, IgA: <2 U/mL (ref 0–3)

## 2019-07-26 LAB — IGA: IgA: 476 mg/dL — ABNORMAL HIGH (ref 61–437)

## 2019-07-26 MED ORDER — INSULIN ASPART 100 UNIT/ML ~~LOC~~ SOLN: 0.0000 [IU] | Freq: Every day | SUBCUTANEOUS | Status: DC

## 2019-07-26 MED ORDER — INSULIN ASPART 100 UNIT/ML ~~LOC~~ SOLN: 0.0000 [IU] | Freq: Three times a day (TID) | SUBCUTANEOUS | Status: DC

## 2019-07-26 MED ORDER — SIMETHICONE 80 MG PO CHEW
80.0000 mg | CHEWABLE_TABLET | Freq: Four times a day (QID) | ORAL | Status: AC
  Administered 2019-07-26 – 2019-07-27 (×3): 80 mg via ORAL
  Filled 2019-07-26 (×3): qty 1

## 2019-07-26 NOTE — Progress Notes (Signed)
Inpatient Diabetes Program Recommendations  AACE/ADA: New Consensus Statement on Inpatient Glycemic Control   Target Ranges:  Prepandial:   less than 140 mg/dL      Peak postprandial:   less than 180 mg/dL (1-2 hours)      Critically ill patients:  140 - 180 mg/dL   Results for ASHWIN, TIBBS (MRN [id number]) as of 07/26/2019 12:03  Ref. Range 07/25/2019 07:24 07/25/2019 11:07 07/25/2019 11:33 07/25/2019 16:32 07/25/2019 17:11 07/25/2019 20:33 07/26/2019 07:40 07/26/2019 11:00  Glucose-Capillary Latest Ref Range: 70 - 99 mg/dL 73 61 (L) 120 (H) 65 (L) 71 155 (H) 95 172 (H)   Review of Glycemic Control  Diabetes history: DM2 Outpatient Diabetes medications: Amaryl 2 mg daily, Invokana 300 mg daily, Lantus 45 units QAM, Metformin 500 mg BID Current orders for Inpatient glycemic control: NONE: CBGs  Inpatient Diabetes Program Recommendations:     Insulin: Hypoglycemia on 07/25/19 noted. Glucose today 95 mg/dl and 172 mg/dl. Please consider ordering Novolog correction scale 0-9 units TID with meals and 0-5 units QHS.  Thanks, Barnie Alderman, RN, MSN, CDE Diabetes Coordinator Inpatient Diabetes Program [phone] (Team Pager from 8am to 5pm)

## 2019-07-26 NOTE — Progress Notes (Signed)
Subjective: Abdomen less tight but still feels more distended than baseline . No nausea or vomiting. +flatus. 2-3 loose stools overnight. Chronic history of diarrhea. Tolerating clear liquids.   Objective: Vital signs in last 24 hours: Temp:  [98.1 F (36.7 C)-98.8 F (37.1 C)] 98.2 F (36.8 C) (06/30 0439) Pulse Rate:  [81-87] 81 (06/30 0439) Resp:  [18] 18 (06/29 1336) BP: (129-143)/(70-86) 143/70 (06/30 0439) SpO2:  [93 %-97 %] 96 % (06/30 0847) Last BM Date: 07/25/99 General:   Alert and oriented, pleasant Head:  Normocephalic and atraumatic. Abdomen:  Bowel sounds present, obese, full but non-tense, no rebound or guarding Msk:  Symmetrical without gross deformities. Normal posture. Pulses:  Normal pulses noted. Extremities:  Without  edema. Neurologic:  Alert and  oriented x4 Psych:  Alert and cooperative. Normal mood and affect.  Intake/Output from previous day: 06/29 0701 - 06/30 0700 In: 1365.1 [P.O.:360; I.V.:1005.1] Out: -  Intake/Output this shift: Total I/O In: 240 [P.O.:240] Out: -   Lab Results: Recent Labs    07/24/19 1257 07/25/19 0448 07/26/19 0504  WBC 7.1 7.0 6.0  HGB 17.7* 17.0 16.8  HCT 52.1* 50.8 48.8  PLT 200 182 179   BMET Recent Labs    07/24/19 1257 07/25/19 0448 07/26/19 0504  NA 145 146* 139  K 5.2* 3.7 3.8  CL 108 111 102  CO2 26 22 26   GLUCOSE 134* 71 97  BUN 18 16 7*  CREATININE 1.34* 1.09 0.99  CALCIUM 9.4 8.7* 8.7*   LFT Recent Labs    07/24/19 1257 07/25/19 0448  PROT 7.7 7.1  ALBUMIN 4.0 3.6  AST 40 48*  ALT 34 38  ALKPHOS 69 61  BILITOT 0.9 0.8     Studies/Results: CT Abdomen Pelvis W Contrast  Result Date: 07/24/2019 CLINICAL DATA:  Abdominal pain for 1 week. EXAM: CT ABDOMEN AND PELVIS WITH CONTRAST TECHNIQUE: Multidetector CT imaging of the abdomen and pelvis was performed using the standard protocol following bolus administration of intravenous contrast. CONTRAST:  172mL OMNIPAQUE IOHEXOL 300 MG/ML   SOLN COMPARISON:  None. FINDINGS: Lower chest: No acute abnormality. Hepatobiliary: Low-attenuation of the liver as can be seen with hepatic steatosis. Normal gallbladder. No biliary ductal dilatation. Pancreas: Unremarkable. No pancreatic ductal dilatation or surrounding inflammatory changes. Spleen: Normal in size without focal abnormality. Adrenals/Urinary Tract: Adrenal glands are unremarkable. Kidneys are normal, without renal calculi, focal lesion, or hydronephrosis. Bladder is unremarkable. Stomach/Bowel: Stomach is within normal limits. Dilated loops of small bowel measuring up to 3.9 cm with fluid-filled loops of bowel with a relative transition point in the right lower quadrant consistent with small bowel obstruction. 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 Vascular/Lymphatic: Normal caliber abdominal aorta with mild atherosclerosis. No lymphadenopathy. Reproductive: Prostate is unremarkable. Other: Small fat containing umbilical hernia. Musculoskeletal: No acute or significant osseous findings. IMPRESSION: 1. Dilated fluid filled loops of small bowel measuring up to 3.9 cm with a relative transition point in the right lower quadrant consistent with small bowel obstruction. 2.  Aortic Atherosclerosis (ICD10-I70.0). Electronically Signed   By: Kathreen Devoid   On: 07/24/2019 15:05   DG Chest Portable 1 View  Result Date: 07/24/2019 CLINICAL DATA:  NG tube placement EXAM: PORTABLE CHEST 1 VIEW COMPARISON:  None. FINDINGS: NG tube coils in the fundus  of the stomach. Lungs are clear. Heart is normal size. No effusions. IMPRESSION: NG tube coils in the fundus of the stomach. Electronically Signed   By: Rolm Baptise M.D.   On: 07/24/2019 18:27   DG Abd Portable 1V-Small Bowel  Obstruction Protocol-24 hr delay  Result Date: 07/25/2019 CLINICAL DATA:  24 hour film.  Bowel obstruction. EXAM: PORTABLE ABDOMEN - 1 VIEW COMPARISON:  July 25, 2019 FINDINGS: Oral contrast is again noted in the colon. There are persistent mildly dilated loops of small bowel, slightly improved from prior study. There is significant distention of the stomach. There is no definite pneumatosis or free air. IMPRESSION: 1. Persistent findings of a partial small bowel obstruction. Oral contrast is noted in the colon to the level of the rectum. 2. Distended stomach. 3. No definite pneumatosis or free air. Electronically Signed   By: Constance Holster M.D.   On: 07/25/2019 21:28   DG Abd Portable 1V-Small Bowel Obstruction Protocol-initial, 8 hr delay  Result Date: 07/25/2019 CLINICAL DATA:  Small-bowel obstruction. EXAM: PORTABLE ABDOMEN - 1 VIEW COMPARISON:  CT scan 07/24/2019 FINDINGS: Persistent air distended small bowel loops in the central abdomen with transition to normal/decompressed distal small bowel loops. However, contrast has moved through the small-bowel and into the colon. Findings suggest partial small bowel obstruction. Contrast noted in the bladder from the recent CT scan. IMPRESSION: Findings consistent with a partial small bowel obstruction. Electronically Signed   By: Marijo Sanes M.D.   On: 07/25/2019 07:50    Assessment: 64 year old male with history of recurrent episodes of abdominal pain, N/V/D, admitted with SBO. Interestingly, multiple studies on file with SBO dating back to 2011. No known autoimmune disease or IBD. Celiac serologies, ANA remain pending. Sed rate is normal. Clinically, he has noted some improvement since admission and tolerating clear liquids. Awaiting serologies to be completed and could consider empiric trial of antibiotics for small bowel bacterial overgrowth if evaluation negative.   Anal warts: extensive. Will need colorectal surgery evaluation.   As  outpatient, will need colonoscopy/EGD. May ultimately need diagnostic laparoscopy.    Plan: Continue clear liquids for now, advancement per surgery Follow-up on pending serologies (ANA, celiac) Colonoscopy/EGD/dilation (history of dysphagia) as outpatient PPI daily Outpatient surgical referral to colorectal surgeon   Annitta Needs, PhD, ANP-BC [facility]      LOS: 2 days    07/26/2019, 11:13 AM

## 2019-07-26 NOTE — Progress Notes (Signed)
[facility] Progress Note     Subjective: Having Bms and tolerating diet. Feels less bloated. GI workup pending.   Objective: Vital signs in last 24 hours: Temp:  [98.1 F (36.7 C)-98.9 F (37.2 C)] 98.9 F (37.2 C) (06/30 1356) Pulse Rate:  [81-85] 85 (06/30 1356) Resp:  [16] 16 (06/30 1356) BP: (129-143)/(70-77) 140/76 (06/30 1356) SpO2:  [95 %-97 %] 96 % (06/30 1356) Last BM Date: 07/25/99  Intake/Output from previous day: 06/29 0701 - 06/30 0700 In: 1365.1 [P.O.:360; I.V.:1005.1] Out: -  Intake/Output this shift: No intake/output data recorded.  General appearance: alert, cooperative and no distress Resp: normal work of breathing GI: soft, minimally distended, nontender  Lab Results:  Recent Labs    07/25/19 0448 07/26/19 0504  WBC 7.0 6.0  HGB 17.0 16.8  HCT 50.8 48.8  PLT 182 179   BMET Recent Labs    07/25/19 0448 07/26/19 0504  NA 146* 139  K 3.7 3.8  CL 111 102  CO2 22 26  GLUCOSE 71 97  BUN 16 7*  CREATININE 1.09 0.99  CALCIUM 8.7* 8.7*   PT/INR No results for input(s): LABPROT, INR in the last 72 hours.  Studies/Results: DG Abd Portable 1V-Small Bowel Obstruction Protocol-24 hr delay  Result Date: 07/25/2019 CLINICAL DATA:  24 hour film.  Bowel obstruction. EXAM: PORTABLE ABDOMEN - 1 VIEW COMPARISON:  July 25, 2019 FINDINGS: Oral contrast is again noted in the colon. There are persistent mildly dilated loops of small bowel, slightly improved from prior study. There is significant distention of the stomach. There is no definite pneumatosis or free air. IMPRESSION: 1. Persistent findings of a partial small bowel obstruction. Oral contrast is noted in the colon to the level of the rectum. 2. Distended stomach. 3. No definite pneumatosis or free air. Electronically Signed   By: Constance Holster M.D.   On: 07/25/2019 21:28   DG Abd Portable 1V-Small Bowel Obstruction Protocol-initial, 8 hr delay  Result Date:  07/25/2019 CLINICAL DATA:  Small-bowel obstruction. EXAM: PORTABLE ABDOMEN - 1 VIEW COMPARISON:  CT scan 07/24/2019 FINDINGS: Persistent air distended small bowel loops in the central abdomen with transition to normal/decompressed distal small bowel loops. However, contrast has moved through the small-bowel and into the colon. Findings suggest partial small bowel obstruction. Contrast noted in the bladder from the recent CT scan. IMPRESSION: Findings consistent with a partial small bowel obstruction. Electronically Signed   By: Marijo Sanes M.D.   On: 07/25/2019 07:50    Anti-infectives: Anti-infectives (From admission, onward)   None      Assessment/Plan: Patient with ? pseudoobstruction from enteritis/ thickening of the small bowel and no overt transition point, no internal hernia or volvulus appreciated.  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

## 2019-07-26 NOTE — Progress Notes (Signed)
PROGRESS NOTE   Benjamin Moreno  GMW:[id number] DOB: 07-28-1955 DOA: 07/24/2019 PCP: Asencion Noble, MD   Chief Complaint  Patient presents with  . Abdominal Pain   Brief Admission History:  64 y.o. male with medical history significant of recurrent bowel obstructions, DM, GERD, HTN. Pt presenting w/ 1 mo h/o abd pain and increasing abd girth. Significant worsening over past 7 days. Pain and distension diffuse w/ localization fo mid abd region. Becoming more SOB as abd becomes more distended. Minimal PO intake due ot pain and full feeling. Eating makes the pain worse. Has had before and states this is his typical sx when he develops a bowel obstruction. No BM in the past week. Typically his bowels are hard to predict, stating that often he has problems holding in his stool when he feels the urge to go. No colonoscopy. No prior abd surgeries.   Assessment & Plan:   Active Problems:   HTN (hypertension)   DM type 2 (diabetes mellitus, type 2) (HCC)   AKI (acute kidney injury) ([place])   Bowel obstruction (HCC)   Anal warts   Chronic diarrhea   Dysphagia   Intestinal pseudoobstruction  1. Recurrent SBO - Pt reports that for many years he has had these recurrences and they are relieved with bowel rest.  Appreciate surgery consult and recommendations.  GI and general surgery input appreciated--had increased abdominal discomfort with oral liquid intake, will hold off on advancing diet for now this time, had liquid stool 2. Hypoglycemia - added dextrose to IV fluids, follow CBG,  3. Hypernatremia -continue IV fluids and encourage oral liquid intake  4. Peri-rectal growth -suspect genital warts, outpatient follow-up with colorectal surgery advised 5. Type 2 DM - follow CBG, holding insulin due to low BS.  6. Essential hypertension - IV hydralazine as needed.  7. GERD - IV protonix ordered.  8. AKI - prerenal. Resolved now with IV fluids.   DVT prophylaxis: subcut heparin Code Status: full  Family  Communication: wife at bedside  Disposition:   Status is: Inpatient  Remains inpatient appropriate because:Ongoing diagnostic testing needed not appropriate for outpatient work up, IV treatments appropriate due to intensity of illness or inability to take PO and Inpatient level of care appropriate due to severity of illness  Dispo: The patient is from: Home              Anticipated d/c is to: Home              Anticipated d/c date is: 3 days              Patient currently is not medically stable to d/c.  Consultants:   Surgery  GI   Procedures:     Antimicrobials:     Subjective: Wife at bedside, patient had liquid stool, nausea, had increased abdominal discomfort with liquid oral intake, no emesis.   Objective: Vitals:   07/26/19 0017 07/26/19 0439 07/26/19 0847 07/26/19 1356  BP: 129/77 (!) 143/70  140/76  Pulse: 82 81  85  Resp:    16  Temp: 98.1 F (36.7 C) 98.2 F (36.8 C)  98.9 F (37.2 C)  TempSrc: Oral Oral  Oral  SpO2: 95% 97% 96% 96%  Weight:      Height:        Intake/Output Summary (Last 24 hours) at 07/26/2019 1906 Last data filed at 07/26/2019 1700 Gross per 24 hour  Intake 1785.98 ml  Output --  Net 1785.98 ml  Filed Weights   07/24/19 1107  Weight: 108.9 kg   Examination:  General exam: Appears calm and comfortable  Respiratory system: Clear to auscultation. Respiratory effort normal. Cardiovascular system: normal S1 & S2 heard, RRR. No JVD, murmurs, rubs, gallops or clicks. No pedal edema. Gastrointestinal system: Abdomen is mildly distended, soft and mild epigastric tenderness. Normal bowel sounds heard. Central nervous system: Alert and oriented. No focal neurological deficits. Extremities: Symmetric 5 x 5 power. Skin: No rashes, lesions or ulcers.  Psychiatry: Judgement and insight appear normal. Mood & affect appropriate.   Data Reviewed:  CBC: Recent Labs  Lab 07/24/19 1257 07/25/19 0448 07/26/19 0504  WBC 7.1 7.0 6.0   NEUTROABS 4.7  --  3.2  HGB 17.7* 17.0 16.8  HCT 52.1* 50.8 48.8  MCV 93.5 94.4 92.2  PLT 200 182 810    Basic Metabolic Panel: Recent Labs  Lab 07/24/19 1257 07/25/19 0448 07/26/19 0504  NA 145 146* 139  K 5.2* 3.7 3.8  CL 108 111 102  CO2 26 22 26   GLUCOSE 134* 71 97  BUN 18 16 7*  CREATININE 1.34* 1.09 0.99  CALCIUM 9.4 8.7* 8.7*    GFR: Estimated Creatinine Clearance: 88.4 mL/min (by C-G formula based on SCr of 0.99 mg/dL).  Liver Function Tests: Recent Labs  Lab 07/24/19 1257 07/25/19 0448  AST 40 48*  ALT 34 38  ALKPHOS 69 61  BILITOT 0.9 0.8  PROT 7.7 7.1  ALBUMIN 4.0 3.6    CBG: Recent Labs  Lab 07/25/19 1711 07/25/19 2033 07/26/19 0740 07/26/19 1100 07/26/19 1635  GLUCAP 71 155* 95 172* 105*    Recent Results (from the past 240 hour(s))  SARS Coronavirus 2 by RT PCR (hospital order, performed in [facility] hospital lab) Nasopharyngeal Nasopharyngeal Swab     Status: None   Collection Time: 07/24/19  4:28 PM   Specimen: Nasopharyngeal Swab  Result Value Ref Range Status   SARS Coronavirus 2 NEGATIVE NEGATIVE Final    Comment: (NOTE) SARS-CoV-2 target nucleic acids are NOT DETECTED.  The SARS-CoV-2 RNA is generally detectable in upper and lower respiratory specimens during the acute phase of infection. The lowest concentration of SARS-CoV-2 viral copies this assay can detect is 250 copies / mL. A negative result does not preclude SARS-CoV-2 infection and should not be used as the sole basis for treatment or other patient management decisions.  A negative result may occur with improper specimen collection / handling, submission of specimen other than nasopharyngeal swab, presence of viral mutation(s) within the areas targeted by this assay, and inadequate number of viral copies (<250 copies / mL). A negative result must be combined with clinical observations, patient history, and epidemiological information.  Fact Sheet for Patients:    StrictlyIdeas.no  Fact Sheet for Healthcare Providers: BankingDealers.co.za  This test is not yet approved or  cleared by the [place] FDA and has been authorized for detection and/or diagnosis of SARS-CoV-2 by FDA under an Emergency Use Authorization (EUA).  This EUA will remain in effect (meaning this test can be used) for the duration of the COVID-19 declaration under Section 564(b)(1) of the Act, 21 U.S.C. section 360bbb-3(b)(1), unless the authorization is terminated or revoked sooner.  Performed at [facility], [address], [place], [place] [address]      Radiology Studies: DG Abd Portable 1V-Small Bowel Obstruction Protocol-24 hr delay  Result Date: 07/25/2019 CLINICAL DATA:  24 hour film.  Bowel obstruction. EXAM: PORTABLE ABDOMEN - 1 VIEW COMPARISON:  July 25, 2019 FINDINGS: Oral contrast is again noted in the colon. There are persistent mildly dilated loops of small bowel, slightly improved from prior study. There is significant distention of the stomach. There is no definite pneumatosis or free air. IMPRESSION: 1. Persistent findings of a partial small bowel obstruction. Oral contrast is noted in the colon to the level of the rectum. 2. Distended stomach. 3. No definite pneumatosis or free air. Electronically Signed   By: Constance Holster M.D.   On: 07/25/2019 21:28   DG Abd Portable 1V-Small Bowel Obstruction Protocol-initial, 8 hr delay  Result Date: 07/25/2019 CLINICAL DATA:  Small-bowel obstruction. EXAM: PORTABLE ABDOMEN - 1 VIEW COMPARISON:  CT scan 07/24/2019 FINDINGS: Persistent air distended small bowel loops in the central abdomen with transition to normal/decompressed distal small bowel loops. However, contrast has moved through the small-bowel and into the colon. Findings suggest partial small bowel obstruction. Contrast noted in the bladder from the recent CT scan. IMPRESSION: Findings consistent with a partial  small bowel obstruction. Electronically Signed   By: Marijo Sanes M.D.   On: 07/25/2019 07:50   Scheduled Meds: . heparin  5,000 Units Subcutaneous Q8H  . insulin aspart  0-5 Units Subcutaneous QHS  . [START ON 07/27/2019] insulin aspart  0-6 Units Subcutaneous TID WC  . ketorolac  60 mg Intramuscular Daily  . pantoprazole (PROTONIX) IV  40 mg Intravenous Q24H  . simethicone  80 mg Oral QID   Continuous Infusions: . dextrose 5 % and 0.45% NaCl 50 mL/hr at 07/26/19 0615     LOS: 2 days    Roxan Hockey, MD  07/26/2019, 7:06 PM

## 2019-07-27 ENCOUNTER — Telehealth: Payer: Self-pay | Admitting: Nurse Practitioner

## 2019-07-27 ENCOUNTER — Encounter: Payer: Self-pay | Admitting: Gastroenterology

## 2019-07-27 DIAGNOSIS — A630 Anogenital (venereal) warts: Secondary | ICD-10-CM

## 2019-07-27 DIAGNOSIS — K529 Noninfective gastroenteritis and colitis, unspecified: Secondary | ICD-10-CM

## 2019-07-27 DIAGNOSIS — E119 Type 2 diabetes mellitus without complications: Secondary | ICD-10-CM

## 2019-07-27 DIAGNOSIS — Z794 Long term (current) use of insulin: Secondary | ICD-10-CM

## 2019-07-27 DIAGNOSIS — R1310 Dysphagia, unspecified: Secondary | ICD-10-CM

## 2019-07-27 DIAGNOSIS — K5989 Other specified functional intestinal disorders: Secondary | ICD-10-CM

## 2019-07-27 DIAGNOSIS — K56609 Unspecified intestinal obstruction, unspecified as to partial versus complete obstruction: Secondary | ICD-10-CM

## 2019-07-27 LAB — GLUCOSE, CAPILLARY
Glucose-Capillary: 136 mg/dL — ABNORMAL HIGH (ref 70–99)
Glucose-Capillary: 197 mg/dL — ABNORMAL HIGH (ref 70–99)
Glucose-Capillary: 164 mg/dL — ABNORMAL HIGH (ref 70–99)

## 2019-07-27 LAB — ANTINUCLEAR ANTIBODIES, IFA: ANA Ab, IFA: NEGATIVE

## 2019-07-27 MED ORDER — ONDANSETRON 4 MG PO TBDP
4.0000 mg | ORAL_TABLET | Freq: Three times a day (TID) | ORAL | 0 refills | Status: DC | PRN
Start: 2019-07-27 — End: 2023-05-24

## 2019-07-27 MED ORDER — AMLODIPINE BESYLATE 10 MG PO TABS: 10.0000 mg | ORAL_TABLET | Freq: Every day | ORAL | 5 refills | Status: AC

## 2019-07-27 MED ORDER — PANTOPRAZOLE SODIUM 40 MG PO TBEC: 40.0000 mg | DELAYED_RELEASE_TABLET | Freq: Every day | ORAL | 2 refills | Status: DC

## 2019-07-27 MED ORDER — SENNOSIDES-DOCUSATE SODIUM 8.6-50 MG PO TABS
2.0000 | ORAL_TABLET | Freq: Every day | ORAL | 1 refills | Status: AC
Start: 2019-07-27 — End: 2019-09-25

## 2019-07-27 MED ORDER — METOPROLOL SUCCINATE ER 100 MG PO TB24: 100.0000 mg | ORAL_TABLET | Freq: Every day | ORAL | 3 refills | Status: DC

## 2019-07-27 MED ORDER — RAMIPRIL 10 MG PO CAPS: 10.0000 mg | ORAL_CAPSULE | Freq: Every day | ORAL | 3 refills | Status: AC

## 2019-07-27 NOTE — Discharge Summary (Signed)
Benjamin Moreno, is a 64 y.o. male  DOB 1956-01-16  MRN [id number].  Admission date:  07/24/2019  Admitting Physician  Waldemar Dickens, MD  Discharge Date:  07/27/2019   Primary MD  Asencion Noble, MD  Recommendations for primary care physician for things to follow:   1)Please have Dr. Willey Blade your primary care physician refer you to a colorectal surgeon in [facility] for treatment of your Anal Warts  2)Very very soft diet, and liquids advised--- avoid excessive solid intake over the next couple days  3)Your diabetes is very poorly controlled, talk to your primary care physician about adjusting your medications  4)Follow-up with Gastroenterologist Dr. Laural Golden as advised for results of your tests that were done during this hospital stay and for further plan   Admission Diagnosis  Bowel obstruction ([place]) [K56.609] Small bowel obstruction ([place]) [K56.609] SBO (small bowel obstruction) ([place]) [K56.609] Encounter for imaging study to confirm nasogastric (NG) tube placement [Z01.89]   Discharge Diagnosis  Bowel obstruction ([place]) [K56.609] Small bowel obstruction ([place]) [K56.609] SBO (small bowel obstruction) ([place]) [K56.609] Encounter for imaging study to confirm nasogastric (NG) tube placement [Z01.89]    Active Problems:   HTN (hypertension)   DM type 2 (diabetes mellitus, type 2) ([place])   AKI (acute kidney injury) ([place])   Bowel obstruction ([place])   Anal warts   Chronic diarrhea   Dysphagia   Intestinal pseudoobstruction      Past Medical History:  Diagnosis Date   Bowel obstruction ([place])    Diabetes mellitus    GERD (gastroesophageal reflux disease)    Hypertension     Past Surgical History:  Procedure Laterality Date   CATARACT EXTRACTION       HPI  from the history and physical done on the day of admission:    Chief Complaint:  Abd Pain  HPI: Benjamin Moreno is a 64 y.o. male with medical  history significant of recurrent bowel obstructions, DM, GERD, HTN. Pt presenting w/ 1 mo h/o abd pain and increasing abd girth. Significant worsening over past 7 days. Pain and distension diffuse w/ localization fo mid abd region. Becoming more SOB as abd becomes more distended. Minimal PO intake due ot pain and full feeling. Eating makes the pain worse. Has had before and states this is his typical sx when he develops a bowel obstruction. No BM in the past week. Typically his bowels are hard to predict, stating that often he has problems holding in his stool when he feels the urge to go. No colonoscopy. No prior abd surgeries.   ED Course: objective findings below. EDP to place NG tube  Review of Systems: As per HPI otherwise all other systems reviewed and are negative   Hospital Course:   Brief Admission History:  64 y.o.malewith medical history significant ofrecurrent bowel obstructions, DM, GERD, HTN. Pt presenting w/ 1 mo h/o abd pain and increasing abd girth. Significant worsening over past 7 days. Pain and distension diffuse w/ localization fo mid abd region. Becoming more SOB as  abd becomes more distended. Minimal PO intake due ot pain and full feeling. Eating makes the pain worse. Has had before and states this is his typical sx when he develops a bowel obstruction. No BM in the past week. Typically his bowels are hard to predict, stating that often he has problems holding in his stool when he feels the urge to go. No colonoscopy. No prior abd surgeries.  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  Appreciate surgery consult and recommendations.  GI and general  surgery input appreciated--- patient is now tolerating oral intake better, desires to go home, passing gas and had BMs -No further hypoglycemic episodes  2)Hypernatremia and AKI -most likely due to dehydration, Sodium normalized and renal function normalized with IV fluids and increase oral intake   3)Peri-rectal growth -suspect genital warts, outpatient follow-up with colorectal surgery advised -Rather extensive, please see photos in epic -As per patient and wife these lesions have been present for more than 20 years  4)Type 2 DM -  resume home regimen  5)Essential hypertension -stable, okay to resume amlodipine, metoprolol and ramipril     DVT prophylaxis: subcut heparin Code Status: full  Family Communication: wife at bedside  Disposition:   Status is: Inpatient  Remains inpatient appropriate because:Ongoing diagnostic testing needed not appropriate for outpatient work up, IV treatments appropriate due to intensity of illness or inability to take PO and Inpatient level of care appropriate due to severity of illness  Dispo: The patient is from: Home  Anticipated d/c is to: Home  Anticipated d/c date is: 3 days  Patient currently is not medically stable to d/c.  Consultants:   Surgery  GI   Discharge Condition: STABLE   Follow UP--- colorectal surgery on for extensive genitalia/perirectal warts  Consults obtained - AS ABOVE  Diet and Activity recommendation:  As advised  Discharge Instructions    Discharge Instructions    Call MD for:  difficulty breathing, headache or visual disturbances   Complete by: As directed    Call MD for:  extreme fatigue   Complete by: As directed    Call MD for:  persistant dizziness or light-headedness   Complete by: As directed    Call MD for:  persistant nausea and vomiting   Complete by: As directed    Call MD for:  severe uncontrolled pain   Complete by: As directed    Call MD for:   temperature >100.4   Complete by: As directed    Diet - low sodium heart healthy   Complete by: As directed    Diet Carb Modified   Complete by: As directed    Discharge instructions   Complete by: As directed    1) please have Dr. Willey Blade your primary care physician refer you to a colorectal surgeon in [place] for treatment of your Anal Warts  2) very very soft diet, and liquids advised--- avoid excessive solid intake over the next couple days  3) your diabetes is very poorly controlled, talk to your primary care physician about adjusting your medications  4) follow-up with gastroenterologist Dr. Laural Golden as advised for results  of your tests that were done during this hospital stay and for further plan   Increase activity slowly   Complete by: As directed         Discharge Medications     Allergies as of 07/27/2019   No Known Allergies     Medication List    TAKE these medications   amLODipine 10 MG tablet Commonly known as: NORVASC Take 1 tablet (10 mg total) by mouth daily.   atorvastatin 20 MG tablet Commonly known as: LIPITOR Take 20 mg by mouth daily.   glimepiride 2 MG tablet Commonly known as: AMARYL Take 2 mg by mouth every morning.   Invokana 300 MG Tabs tablet Generic drug: canagliflozin Take 300 mg by mouth daily.   Lantus SoloStar 100 UNIT/ML Solostar Pen Generic drug: insulin glargine Inject 45 Units into the skin every morning.   metFORMIN 500 MG tablet Commonly known as: GLUCOPHAGE Take 500 mg by mouth 2 (two) times daily.   metoprolol succinate 100 MG 24 hr tablet Commonly known as: TOPROL-XL Take 1 tablet (100 mg total) by mouth daily. Take with or immediately following a meal.   ondansetron 4 MG disintegrating tablet Commonly known as: Zofran ODT Take 1 tablet (4 mg total) by mouth every 8 (eight) hours as needed for nausea or vomiting.   pantoprazole 40 MG tablet Commonly known as: PROTONIX Take 1 tablet (40 mg total) by mouth daily.    ramipril 10 MG capsule Commonly known as: ALTACE Take 1 capsule (10 mg total) by mouth daily.   senna-docusate 8.6-50 MG tablet Commonly known as: Senokot-S Take 2 tablets by mouth at bedtime.       Major procedures and Radiology Reports - PLEASE review detailed and final reports for all details, in brief -   CT Abdomen Pelvis W Contrast  Result Date: 07/24/2019 CLINICAL DATA:  Abdominal pain for 1 week. EXAM: CT ABDOMEN AND PELVIS WITH CONTRAST TECHNIQUE: Multidetector CT imaging of the abdomen and pelvis was performed using the standard protocol following bolus administration of intravenous contrast. CONTRAST:  18mL OMNIPAQUE IOHEXOL 300 MG/ML  SOLN COMPARISON:  None. FINDINGS: Lower chest: No acute abnormality. Hepatobiliary: Low-attenuation of the liver as can be seen with hepatic steatosis. Normal gallbladder. No biliary ductal dilatation. Pancreas: Unremarkable. No pancreatic ductal dilatation or surrounding inflammatory changes. Spleen: Normal in size without focal abnormality. Adrenals/Urinary Tract: Adrenal glands are unremarkable. Kidneys are normal, without renal calculi, focal lesion, or hydronephrosis. Bladder is unremarkable. Stomach/Bowel: Stomach is within normal limits. Dilated loops of small bowel measuring up to 3.9 cm with fluid-filled loops of bowel with a relative transition point in the right lower quadrant consistent with small bowel obstruction. 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 Vascular/Lymphatic: Normal caliber abdominal aorta with mild atherosclerosis. No lymphadenopathy. Reproductive: Prostate is unremarkable. Other: Small fat containing umbilical hernia. Musculoskeletal: No acute or significant osseous findings. IMPRESSION: 1. Dilated  fluid filled loops of small bowel measuring up to 3.9 cm with a relative transition point in the right lower quadrant consistent with small bowel obstruction. 2.  Aortic Atherosclerosis (ICD10-I70.0). Electronically Signed   By: Kathreen Devoid   On: 07/24/2019 15:05   DG Chest Portable 1 View  Result Date: 07/24/2019 CLINICAL DATA:  NG tube placement EXAM: PORTABLE CHEST 1 VIEW COMPARISON:  None. FINDINGS: NG tube coils in the fundus of the stomach. Lungs are clear. Heart is normal size. No effusions. IMPRESSION: NG tube coils in the fundus of the stomach. Electronically Signed   By: Rolm Baptise M.D.   On: 07/24/2019 18:27   DG Abd Portable 1V-Small Bowel Obstruction Protocol-24 hr delay  Result Date: 07/25/2019 CLINICAL DATA:  24 hour film.  Bowel obstruction. EXAM: PORTABLE ABDOMEN - 1 VIEW COMPARISON:  July 25, 2019 FINDINGS: Oral contrast is again noted in the colon. There are persistent mildly dilated loops of small bowel, slightly improved from prior study. There is significant distention of the stomach. There is no definite pneumatosis or free air. IMPRESSION: 1. Persistent findings of a partial small bowel obstruction. Oral contrast is noted in the colon to the level of the rectum. 2. Distended stomach. 3. No definite pneumatosis or free air. Electronically Signed   By: Constance Holster M.D.   On: 07/25/2019 21:28   DG Abd Portable 1V-Small Bowel Obstruction Protocol-initial, 8 hr delay  Result Date: 07/25/2019 CLINICAL DATA:  Small-bowel obstruction. EXAM: PORTABLE ABDOMEN - 1 VIEW COMPARISON:  CT scan 07/24/2019 FINDINGS: Persistent air distended small bowel loops in the central abdomen with transition to normal/decompressed distal small bowel loops. However, contrast has moved through the small-bowel and into the colon. Findings suggest partial small bowel obstruction. Contrast noted in the bladder from the recent CT scan. IMPRESSION: Findings consistent with a partial small bowel  obstruction. Electronically Signed   By: Marijo Sanes M.D.   On: 07/25/2019 07:50    Micro Results   Recent Results (from the past 240 hour(s))  SARS Coronavirus 2 by RT PCR (hospital order, performed in [facility] hospital lab) Nasopharyngeal Nasopharyngeal Swab     Status: None   Collection Time: 07/24/19  4:28 PM   Specimen: Nasopharyngeal Swab  Result Value Ref Range Status   SARS Coronavirus 2 NEGATIVE NEGATIVE Final    Comment: (NOTE) SARS-CoV-2 target nucleic acids are NOT DETECTED.  The SARS-CoV-2 RNA is generally detectable in upper and lower respiratory specimens during the acute phase of infection. The lowest concentration of SARS-CoV-2 viral copies this assay can detect is 250 copies / mL. A negative result does not preclude SARS-CoV-2 infection and should not be used as the sole basis for treatment or other patient management decisions.  A negative result may occur with improper specimen collection / handling, submission of specimen other than nasopharyngeal swab, presence of viral mutation(s) within the areas targeted by this assay, and inadequate number of viral copies (<250 copies / mL). A negative result must be combined with clinical observations, patient history, and epidemiological information.  Fact Sheet for Patients:   StrictlyIdeas.no  Fact Sheet for Healthcare Providers: BankingDealers.co.za  This test is not yet approved or  cleared by the [place] FDA and has been authorized for detection and/or diagnosis of SARS-CoV-2 by FDA under an Emergency Use Authorization (EUA).  This EUA will remain in effect (meaning this test can be used) for the duration of the COVID-19 declaration under Section 564(b)(1) of the Act, 21 U.S.C. section 360bbb-3(b)(1), unless the authorization is terminated or revoked sooner.  Performed at [facility], [address], [place], [place] [address]        Today    Subjective    Benjamin Moreno today has no new complaints No fever  Or chills   No Nausea, Vomiting or Diarrhea         Patient has been seen  and examined prior to discharge   Objective   Blood pressure (!) 164/93, pulse 88, temperature 97.6 F (36.4 C), temperature source Oral, resp. rate 18, height 5\' 6"  (1.676 m), weight 108.9 kg, SpO2 97 %.   Intake/Output Summary (Last 24 hours) at 07/27/2019 1607 Last data filed at 07/27/2019 0525 Gross per 24 hour  Intake 1077.23 ml  Output --  Net 1077.23 ml    Exam Gen:- Awake Alert, no acute distress  HEENT:- [place].AT, No sclera icterus Neck-Supple Neck,No JVD,.  Lungs-  CTAB , good air movement bilaterally  CV- S1, S2 normal, regular Abd-  +ve B.Sounds, Abd Soft, No tenderness,    Extremity/Skin:- No  edema,   good pulses Psych-affect is appropriate, oriented x3 Neuro-no new focal deficits, no tremors  GU--extensive perianal warts-- -Media Information   Document Information  Photos    07/25/2019 16:48  Attached To:  Hospital Encounter on 07/24/19  Source Information  Virl Cagey, MD   Ap-Dept 300      Data Review   CBC w Diff:  Lab Results  Component Value Date   WBC 6.0 07/26/2019   HGB 16.8 07/26/2019   HCT 48.8 07/26/2019   PLT 179 07/26/2019   LYMPHOPCT 31 07/26/2019   BANDSPCT 0 06/06/2011   MONOPCT 10 07/26/2019   EOSPCT 4 07/26/2019   BASOPCT 1 07/26/2019    CMP:  Lab Results  Component Value Date   NA 139 07/26/2019   K 3.8 07/26/2019   CL 102 07/26/2019   CO2 26 07/26/2019   BUN 7 (L) 07/26/2019   CREATININE 0.99 07/26/2019   PROT 7.1 07/25/2019   ALBUMIN 3.6 07/25/2019   BILITOT 0.8 07/25/2019   ALKPHOS 61 07/25/2019   AST 48 (H) 07/25/2019   ALT 38 07/25/2019  .   Total Discharge time is about 33 minutes  Roxan Hockey M.D on 07/27/2019 at 4:07 PM  Go to [email or web address] -  for contact info  [facility] - Office  [phone]

## 2019-07-27 NOTE — Telephone Encounter (Signed)
OV made with [place] on 7/28 at 2 pm and letter mailed

## 2019-07-27 NOTE — Discharge Instructions (Signed)
1) please have Dr. Willey Blade your primary care physician refer you to a colorectal surgeon in [facility] for treatment of your Anal Warts  2) very very soft diet, and liquids advised--- avoid excessive solid intake over the next couple days  3) your diabetes is very poorly controlled, talk to your primary care physician about adjusting your medications  4) follow-up with gastroenterologist Dr. Laural Golden as advised for results of your tests that were done during this hospital stay and for further plan

## 2019-07-27 NOTE — Telephone Encounter (Signed)
Please schedule hosp follow-up 2-4 weeks with AB, [place], or myself

## 2019-07-27 NOTE — Progress Notes (Signed)
Subjective: Feeling good today. No bowel movement, passing flarus. No ongoing abdominal pain, N/V. Feels like he can go home and do well. Tolerated diet well this morning. Discussed need for continued outpatient workup and he verbalized understanding.  Objective: Vital signs in last 24 hours: Temp:  [98.2 F (36.8 C)-98.9 F (37.2 C)] 98.2 F (36.8 C) (07/01 0450) Pulse Rate:  [73-85] 73 (07/01 0450) Resp:  [16-20] 20 (07/01 0450) BP: (131-142)/(76-93) 142/93 (07/01 0450) SpO2:  [93 %-96 %] 96 % (07/01 0450) Last BM Date: 07/25/19 General:   Alert and oriented, pleasant Head:  Normocephalic and atraumatic. Eyes:  No icterus, sclera clear. Conjuctiva pink.  Neck:  Supple, without thyromegaly or masses.  Heart:  S1, S2 present, no murmurs noted.  Lungs: Clear to auscultation bilaterally, without wheezing, rales, or rhonchi.  Abdomen:  Bowel sounds present, soft, non-distended. Minimal abdominal TTP. No HSM or hernias noted. No rebound or guarding. Msk:  Symmetrical without gross deformities. Pulses:  Normal bilateral DP pulses noted. Extremities:  Without clubbing or edema. Neurologic:  Alert and  oriented x4;  grossly normal neurologically. Psych:  Alert and cooperative. Normal mood and affect.  Intake/Output from previous day: 06/30 0701 - 07/01 0700 In: 1831.3 [P.O.:600; I.V.:1231.3] Out: -  Intake/Output this shift: No intake/output data recorded.  Lab Results: Recent Labs    07/24/19 1257 07/25/19 0448 07/26/19 0504  WBC 7.1 7.0 6.0  HGB 17.7* 17.0 16.8  HCT 52.1* 50.8 48.8  PLT 200 182 179   BMET Recent Labs    07/24/19 1257 07/25/19 0448 07/26/19 0504  NA 145 146* 139  K 5.2* 3.7 3.8  CL 108 111 102  CO2 26 22 26   GLUCOSE 134* 71 97  BUN 18 16 7*  CREATININE 1.34* 1.09 0.99  CALCIUM 9.4 8.7* 8.7*   LFT Recent Labs    07/24/19 1257 07/25/19 0448  PROT 7.7 7.1  ALBUMIN 4.0 3.6  AST 40 48*  ALT 34 38  ALKPHOS 69 61  BILITOT 0.9 0.8    PT/INR No results for input(s): LABPROT, INR in the last 72 hours. Hepatitis Panel No results for input(s): HEPBSAG, HCVAB, HEPAIGM, HEPBIGM in the last 72 hours.   Studies/Results: DG Abd Portable 1V-Small Bowel Obstruction Protocol-24 hr delay  Result Date: 07/25/2019 CLINICAL DATA:  24 hour film.  Bowel obstruction. EXAM: PORTABLE ABDOMEN - 1 VIEW COMPARISON:  July 25, 2019 FINDINGS: Oral contrast is again noted in the colon. There are persistent mildly dilated loops of small bowel, slightly improved from prior study. There is significant distention of the stomach. There is no definite pneumatosis or free air. IMPRESSION: 1. Persistent findings of a partial small bowel obstruction. Oral contrast is noted in the colon to the level of the rectum. 2. Distended stomach. 3. No definite pneumatosis or free air. Electronically Signed   By: Constance Holster M.D.   On: 07/25/2019 21:28    Assessment: 64 year old male with history of recurrent episodes of abdominal pain, N/V/D, admitted with SBO. Interestingly, multiple studies on file with SBO dating back to 2011. No known autoimmune disease or IBD. Celiac serologies essentially normal, Sed rate normal. ANA remains pending. Clinically, he has noted some improvement since admission and tolerating clear liquids yesterday. Today he continues to do well, no resting abdominal pain and tolerating carb mod diet. Feels he could do well at home. Awaiting serologies to be completed and could consider empiric trial of antibiotics for small bowel bacterial overgrowth if evaluation negative.  Anal warts: Extensive. Has seen local surgeon as IP. Will need colorectal surgery evaluation.   As outpatient, will need colonoscopy/EGD. May ultimately need diagnostic laparoscopy.   Plan: 1. Follow pending labs 2. Plan outpatient follow-up in 2-4 weeks 3. Need for outpatient colonoscopy/EGD 4. Outpatient referral to colorectal surgery 5. PPI at  discharge 6. Supportive measures 7. Anticipate d/c in the next 24 hours    LOS: 3 days    07/27/2019, 11:14 AM

## 2019-07-27 NOTE — Progress Notes (Signed)
Nsg Discharge Note  Admit Date:  07/24/2019 Discharge date: 07/27/2019   Benjamin Moreno to be D/C'd Home per MD order.  AVS completed.  Copy for chart, and copy for patient signed, and dated. Patient/caregiver able to verbalize understanding.  Discharge Medication: Allergies as of 07/27/2019   No Known Allergies     Medication List    TAKE these medications   amLODipine 10 MG tablet Commonly known as: NORVASC Take 1 tablet (10 mg total) by mouth daily.   atorvastatin 20 MG tablet Commonly known as: LIPITOR Take 20 mg by mouth daily.   glimepiride 2 MG tablet Commonly known as: AMARYL Take 2 mg by mouth every morning.   Invokana 300 MG Tabs tablet Generic drug: canagliflozin Take 300 mg by mouth daily.   Lantus SoloStar 100 UNIT/ML Solostar Pen Generic drug: insulin glargine Inject 45 Units into the skin every morning.   metFORMIN 500 MG tablet Commonly known as: GLUCOPHAGE Take 500 mg by mouth 2 (two) times daily.   metoprolol succinate 100 MG 24 hr tablet Commonly known as: TOPROL-XL Take 1 tablet (100 mg total) by mouth daily. Take with or immediately following a meal.   ondansetron 4 MG disintegrating tablet Commonly known as: Zofran ODT Take 1 tablet (4 mg total) by mouth every 8 (eight) hours as needed for nausea or vomiting.   pantoprazole 40 MG tablet Commonly known as: PROTONIX Take 1 tablet (40 mg total) by mouth daily.   ramipril 10 MG capsule Commonly known as: ALTACE Take 1 capsule (10 mg total) by mouth daily.   senna-docusate 8.6-50 MG tablet Commonly known as: Senokot-S Take 2 tablets by mouth at bedtime.       Discharge Assessment: Vitals:   07/27/19 0450 07/27/19 1358  BP: (!) 142/93 (!) 164/93  Pulse: 73 88  Resp: 20 18  Temp: 98.2 F (36.8 C) 97.6 F (36.4 C)  SpO2: 96% 97%   Skin clean, dry and intact without evidence of skin break down, no evidence of skin tears noted. IV catheter discontinued intact. Site without signs and  symptoms of complications - no redness or edema noted at insertion site, patient denies c/o pain - only slight tenderness at site.  Dressing with slight pressure applied.  D/c Instructions-Education: Discharge instructions given to patient/family with verbalized understanding. D/c education completed with patient/family including follow up instructions, medication list, d/c activities limitations if indicated, with other d/c instructions as indicated by MD - patient able to verbalize understanding, all questions fully answered. Patient instructed to return to ED, call 911, or call MD for any changes in condition.  Patient escorted via [place], and D/C home via private auto.  Loa Socks, RN 07/27/2019 4:40 PM

## 2019-08-18 DIAGNOSIS — E1129 Type 2 diabetes mellitus with other diabetic kidney complication: Secondary | ICD-10-CM | POA: Diagnosis not present

## 2019-08-18 DIAGNOSIS — D751 Secondary polycythemia: Secondary | ICD-10-CM | POA: Diagnosis not present

## 2019-08-18 DIAGNOSIS — Z79899 Other long term (current) drug therapy: Secondary | ICD-10-CM | POA: Diagnosis not present

## 2019-08-22 NOTE — Progress Notes (Signed)
Referring Provider: Asencion Noble, MD Primary Care Physician:  Asencion Noble, MD Primary GI Physician: Dr. Oneida Alar (pending Dr. Abbey Chatters); Dr. Gala Romney following currently  Chief Complaint  Patient presents with   Hospitalization Follow-up    has had runny stools with gas    HPI:   Benjamin Moreno is a 64 y.o. male with medical history significant ofrecurrent bowel obstructions, DM, GERD, and HTN presenting today for hospital follow-up.  Patient admitted 07/24/2019-07/27/2019 for small bowel obstruction.  Reported 1 mo h/o abd pain and increasing abd girth. Significant worsening over past 7 days. CT A/P with contrast revealed dilated fluid filled loops of small bowel measuring up to 3.9 cm with a relative transition point in the right lower quadrant consistent with small bowel obstruction. CBC essentially normal. CMP with potassium 5.2, creatinine 1.34. Lipase wnl.  We saw patient during hospitalization.  He reported years of what he suspected to be recurrent SBO's with symptoms including BMs, nausea, and vomiting.  Symptoms resolved on their own as he would stop eating for a few days.  Also with chronic mild abdominal pain that never completely resolves.  No prior abdominal surgeries.  Also chronically with 5-8 watery BMs daily with urgency.  Abdominal cramping prior to BMs that resolves or after.  No nocturnal stools.  No prior colonoscopy.  No unintentional weight loss.  Celiac serologies were negative.  TSH low normal.  Sed rate 13.  ANA within normal limits. Dr. Constance Haw reviewed prior CT imaging with Dr. Thornton Papas.  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  Patient improved clinically with supportive measures.   General surgery recommended GI work-up.  If inconclusive, may need outpatient laparoscopic exploration.  Notably, patient also reported rectal mass that had been present for 20+ years that was consistent with extensive anal warts.  Recommended referral to colorectal surgein. Also admitted to occasional solid food dysphagia.  Ultimately, recommended outpatient EGD/colonoscopy and if studies were normal, consider small bowel capsule study with agile capsule prior.  Also stated to consider empiric trial of antibiotic therapy for SIBO if celiac serologies and ANA negative.  Today: Has some mild abdominal pain daily with meals. This is chronic. States he is just used to this. Its not really like a pain at this point. Last a couple hours and resolves on its own. Pain may improve after a BM, other times it doesn't. No nausea or vomiting. Eating a soft diet. Limiting meats and breads for now. BMs loose after meals. Can have 5-8 BMs daily. Associated urgency. Having a lot of gas. Rare nocturnal stools. No blood in the stool.  Continues with toilet tissue hematochezia.  No black stool.   GERD well controlled. Continues with occasional solid food dysphagia about 1-2 times a week. Will resolve on its own. Has had to regurgitate in the past.   No unintentional weight loss.   Not sure what his blood sugars have been running at home.   Past Medical History:  Diagnosis Date   Bowel obstruction (HCC)    Diabetes mellitus    GERD (gastroesophageal reflux disease)    HLD (hyperlipidemia)    Hypertension     Past Surgical History:  Procedure Laterality Date   CATARACT EXTRACTION      Current  Outpatient Medications  Medication Sig Dispense Refill   amLODipine (NORVASC) 10 MG tablet Take 1 tablet (10 mg total) by mouth daily. 30 tablet 5   atorvastatin (LIPITOR) 20 MG tablet Take 20 mg by mouth daily.     glimepiride (AMARYL) 2 MG tablet Take 2 mg by mouth every morning.     INVOKANA 300 MG TABS  tablet Take 300 mg by mouth daily.     LANTUS SOLOSTAR 100 UNIT/ML Solostar Pen Inject 45 Units into the skin every morning.     metFORMIN (GLUCOPHAGE) 500 MG tablet Take 500 mg by mouth 2 (two) times daily.     metoprolol succinate (TOPROL-XL) 100 MG 24 hr tablet Take 1 tablet (100 mg total) by mouth daily. Take with or immediately following a meal. 30 tablet 3   ondansetron (ZOFRAN ODT) 4 MG disintegrating tablet Take 1 tablet (4 mg total) by mouth every 8 (eight) hours as needed for nausea or vomiting. 10 tablet 0   pantoprazole (PROTONIX) 40 MG tablet Take 1 tablet (40 mg total) by mouth daily. 30 tablet 2   ramipril (ALTACE) 10 MG capsule Take 1 capsule (10 mg total) by mouth daily. 30 capsule 3   senna-docusate (SENOKOT-S) 8.6-50 MG tablet Take 2 tablets by mouth at bedtime. (Patient taking differently: Take 2 tablets by mouth daily. ) 60 tablet 1   No current facility-administered medications for this visit.    Allergies as of 08/23/2019   (No Known Allergies)    Family History  Problem Relation Age of Onset   Cancer Mother    Stomach cancer Mother    Heart disease Father    Bowel Disease Paternal Grandmother        Patient isn't sure the etiology. Required colostomy.    Stomach cancer Maternal Uncle        several uncles with stomach cancer   Colon cancer Neg Hx    Inflammatory bowel disease Neg Hx     Social History   Socioeconomic History   Marital status: Married    Spouse name: Not on file   Number of children: Not on file   Years of education: Not on file   Highest education level: Not on file  Occupational History   Not on file  Tobacco Use   Smoking status: Never Smoker   Smokeless tobacco: Never Used  Vaping Use   Vaping Use: Never used  Substance and Sexual Activity   Alcohol use: No   Drug use: No   Sexual activity: Yes  Other Topics Concern   Not on file  Social History Narrative   Not on file   Social Determinants of  Health   Financial Resource Strain:    Difficulty of Paying Living Expenses:   Food Insecurity:    Worried About Charity fundraiser in the Last Year:    Arboriculturist in the Last Year:   Transportation Needs:    Film/video editor (Medical):    Lack of Transportation (Non-Medical):   Physical Activity:    Days of Exercise per Week:    Minutes of Exercise per Session:   Stress:    Feeling of Stress :   Social Connections:    Frequency of Communication with Friends and Family:    Frequency of Social Gatherings with Friends and Family:    Attends Religious Services:    Active Member of Clubs or Organizations:    Attends Archivist Meetings:    Marital  Status:     Review of Systems: Gen: Denies fever, chills, cold or flulike symptoms, lightheadedness, dizziness, presyncope, syncope. CV: Denies chest pain or palpitations. Resp: Denies dyspnea at rest.  Admits to mild SOB with exertion.  No regular cough. GI: See HPI  Heme: See HPI  Physical Exam: BP (!) 155/89    Pulse 89    Temp (!) 96.9 F (36.1 C) (Temporal)    Ht 5\' 4"  (1.626 m)    Wt (!) 247 lb 9.6 oz (112.3 kg)    BMI 42.50 kg/m  General:   Alert and oriented. No distress noted. Pleasant and cooperative.  Head:  Normocephalic and atraumatic. Eyes:  Conjuctiva clear without scleral icterus. Heart:  S1, S2 present without murmurs appreciated. Lungs:  Clear to auscultation bilaterally. No wheezes, rales, or rhonchi. No distress.  Abdomen:  +BS, soft, non-tender and non-distended. No rebound or guarding. No HSM or masses noted. Msk:  Symmetrical without gross deformities. Normal posture. Extremities:  Without edema. Neurologic:  Alert and  oriented x4 Psych:  Normal mood and affect.

## 2019-08-23 ENCOUNTER — Encounter: Payer: Self-pay | Admitting: Gastroenterology

## 2019-08-23 ENCOUNTER — Other Ambulatory Visit: Payer: Self-pay

## 2019-08-23 ENCOUNTER — Encounter: Payer: Self-pay | Admitting: *Deleted

## 2019-08-23 ENCOUNTER — Ambulatory Visit: Payer: BC Managed Care – PPO | Admitting: Gastroenterology

## 2019-08-23 VITALS — BP 155/89 | HR 89 | Temp 96.9°F | Ht 64.0 in | Wt 247.6 lb

## 2019-08-23 DIAGNOSIS — A630 Anogenital (venereal) warts: Secondary | ICD-10-CM | POA: Diagnosis not present

## 2019-08-23 DIAGNOSIS — K529 Noninfective gastroenteritis and colitis, unspecified: Secondary | ICD-10-CM | POA: Diagnosis not present

## 2019-08-23 DIAGNOSIS — R1310 Dysphagia, unspecified: Secondary | ICD-10-CM | POA: Diagnosis not present

## 2019-08-23 DIAGNOSIS — K56609 Unspecified intestinal obstruction, unspecified as to partial versus complete obstruction: Secondary | ICD-10-CM | POA: Diagnosis not present

## 2019-08-23 DIAGNOSIS — K625 Hemorrhage of anus and rectum: Secondary | ICD-10-CM

## 2019-08-23 DIAGNOSIS — R1084 Generalized abdominal pain: Secondary | ICD-10-CM

## 2019-08-23 NOTE — Patient Instructions (Signed)
Please have stool studies completed.  We will get you scheduled for a colonoscopy and upper endoscopy with possible dilation of your esophagus in the near future with Dr. Abbey Chatters.  1 day prior to procedure: Take one half dose Lantus (22.5 units), one half dose glimepiride (1 mg), one half dose Invokana (150 mg), one half dose of metformin (250 mg in the morning and evening).  Morning of the procedure: Do not take morning diabetes medications.  Keep a close check on your blood sugars while you are on clear liquids and correct andy low blood sugars with sugary clear liquids.   Continue following a soft diet for now.  We will refer you to [facility] Surgery for anal warts.  We will see you back after your procedures.   Aliene Altes, PA-C [facility]

## 2019-08-24 ENCOUNTER — Encounter: Payer: Self-pay | Admitting: Gastroenterology

## 2019-08-24 NOTE — Assessment & Plan Note (Signed)
Extensive anal warts noted during recent hospitalization for small bowel obstruction.  General surgery was consulted and recommended referral to colorectal surgeon.  Referral has not yet been placed.  We will refer patient to [facility] surgery.

## 2019-08-24 NOTE — Assessment & Plan Note (Addendum)
64 year old male with history of recurrent small bowel obstructions.  First imaging dated back to 2011.  Recently hospitalized from 6/28-07/27/2019 for SBO.  CT A/P with dilated fluid-filled loops of small bowel with relative transition point in the right lower quadrant.  General surgery was consulted.  Dr. Constance Haw with general surgery reviewed images herself as well as with Dr. Thornton Papas (radiologist) and stated, "Prior imaging dating back to 2011 with thickened small intestine, hypertrophic/ hyperemic vasculature to the small intestine, no internal hernia or volvulus noted, no clear transition and more of tapering gradually to decompressed normal bowel; fecallized bowel in 2021."  Dr. Constance Haw recommended thorough GI evaluation with consideration of laparoscopic exploration if needed thereafter.  Celiac serologies, TSH, ANA, and sed rate within normal limits.  Clinically, patient reports chronic history of mid abdominal pain that worsens postprandially, chronic diarrhea with urgency, bloating, and intermittent recurrent likely SBO's/pseudoobstructions with associated abdominal distention, decreased bowel frequency, nausea, and vomiting for which he will stop eating and symptoms often resolve on their own.  Patient is back to his baseline since hospitalization with daily mild abdominal pain with meals and diarrhea nausea or vomiting.  Following a soft diet.  No prior TCS or EGD.    Concern for possible IBD.  Could also have SIBO or IBS contributing to symptoms.  Doubt infectious diarrhea as symptoms are chronic but will rule this out for completeness.  Plan: Proceed with TCS + EGD/ED (for dysphagia as per below) with propofol with Dr. Abbey Chatters in the near future. The risks, benefits, and alternatives have been discussed in detail with patient. They have stated understanding and desire to proceed.  ASA III See separate instructions for diabetes medication adjustments. Complete C. difficile and GI pathogen  panel. Consider treatment for SIBO if stool testing is negative. Continue following a soft diet. Follow-up after procedures.

## 2019-08-24 NOTE — Assessment & Plan Note (Addendum)
Chronic history of loose to watery BMs.  Currently with 5-8 BMs daily.  Rare nocturnal stools.  Associated urgency.  Also with chronic abdominal pain worsened with meals and increased gas/bloating.  Interestingly, patient has also had recurrent small bowel obstructions versus pseudoobstruction as discussed above.  Recent evaluation during hospitalization with celiac serologies, TSH, ANA, and sed rate within normal limits.  No prior colonoscopy.  No significant rectal bleeding.  He does have low-volume toilet tissue hematochezia in the setting of extensive anal warts for which he is being referred to colorectal surgery.  Differentials include IBD, IBS-D, SIBO, and less likely infectious diarrhea.  Plan: Check C. difficile and GI pathogen panel.  Consider treatment for SIBO if stool testing is negative. Proceed with colonoscopy with propofol Dr. Abbey Chatters in the near future. The risks, benefits, and alternatives have been discussed in detail with patient. They have stated understanding and desire to proceed.  ASA III See separate instructions for diabetes medication adjustments. Follow-up after procedure.

## 2019-08-24 NOTE — Assessment & Plan Note (Signed)
Solid food dysphagia 1-2 times a week.  Eventually, food tends to pass on its own.  He has had to regurgitate food in the past.  History of GERD, but this is well controlled on Protonix 40 mg daily.  Differentials include esophageal web, ring, or stricture, and less likely malignancy.  Plan: Proceed with EGD/ED with propofol with Dr. Abbey Chatters in the near future. The risks, benefits, and alternatives have been discussed in detail with patient. They have stated understanding and desire to proceed.  ASA III Continue Protonix 40 mg daily. Follow-up after procedure.

## 2019-08-24 NOTE — Assessment & Plan Note (Signed)
Addressed under small bowel obstruction.

## 2019-08-24 NOTE — Assessment & Plan Note (Signed)
Low volume toilet tissue hematochezia.  No blood in the stool or in toilet water.  No melena.  Suspect this is secondary to oozing from extensive anal warts that were noted during prior hospitalization.  No rectal exam today.  See Dr. Constance Haw (general surgeon) note dated 07/25/2019 for picture of anal warts.  We are placing referral to [facility] surgery for management of anal warts.  Notably, patient needs first ever colonoscopy which will help to rule out any other etiology of rectal bleeding.   Plan: Refer to [facility] surgery Proceed with colonoscopy with propofol with Dr. Abbey Chatters in the near future. The risks, benefits, and alternatives have been discussed in detail with patient. They have stated understanding and desire to proceed.  ASA III Follow-up after procedure.

## 2019-08-25 DIAGNOSIS — K56609 Unspecified intestinal obstruction, unspecified as to partial versus complete obstruction: Secondary | ICD-10-CM | POA: Diagnosis not present

## 2019-08-25 DIAGNOSIS — E1129 Type 2 diabetes mellitus with other diabetic kidney complication: Secondary | ICD-10-CM | POA: Diagnosis not present

## 2019-08-25 DIAGNOSIS — A630 Anogenital (venereal) warts: Secondary | ICD-10-CM | POA: Diagnosis not present

## 2019-08-25 DIAGNOSIS — Z6841 Body Mass Index (BMI) 40.0 and over, adult: Secondary | ICD-10-CM | POA: Diagnosis not present

## 2019-08-28 ENCOUNTER — Other Ambulatory Visit: Payer: Self-pay | Admitting: *Deleted

## 2019-08-28 DIAGNOSIS — A630 Anogenital (venereal) warts: Secondary | ICD-10-CM

## 2019-09-05 DIAGNOSIS — A630 Anogenital (venereal) warts: Secondary | ICD-10-CM | POA: Diagnosis not present

## 2019-09-21 NOTE — Patient Instructions (Addendum)
Your procedure is scheduled on:09/26/2019   Report to [facility] at     12:20 PM.  Call this number if you have problems the morning of surgery: [phone]   Remember:              Follow Directions on the letter you received from Your Physician's office regarding the Bowel Prep              No Diabetic medication am of Procedure :   Take these medicines the morning of surgery with A SIP OF WATER: Amlodipine, metoprolol, and pantoprazole   Do not wear jewelry, make-up or nail polish.    Do not bring valuables to the hospital.  Contacts, dentures or bridgework may not be worn into surgery.  .   Patients discharged the day of surgery will not be allowed to drive home.     Colonoscopy, Adult, Care After This sheet gives you information about how to care for yourself after your procedure. Your health care provider may also give you more specific instructions. If you have problems or questions, contact your health care provider. What can I expect after the procedure? After the procedure, it is common to have:  A small amount of blood in your stool for 24 hours after the procedure.  Some gas.  Mild abdominal cramping or bloating.  Follow these instructions at home: General instructions   For the first 24 hours after the procedure: ? Do not drive or use machinery. ? Do not sign important documents. ? Do not drink alcohol. ? Do your regular daily activities at a slower pace than normal. ? Eat soft, easy-to-digest foods. ? Rest often.  Take over-the-counter or prescription medicines only as told by your health care provider.  It is up to you to get the results of your procedure. Ask your health care provider, or the department performing the procedure, when your results will be ready. Relieving cramping and bloating  Try walking around when you have cramps or feel bloated.  Apply heat to your abdomen as told by your health care provider. Use a heat source that your  health care provider recommends, such as a moist heat pack or a heating pad. ? Place a towel between your skin and the heat source. ? Leave the heat on for 20-30 minutes. ? Remove the heat if your skin turns bright red. This is especially important if you are unable to feel pain, heat, or cold. You may have a greater risk of getting burned. Eating and drinking  Drink enough fluid to keep your urine clear or pale yellow.  Resume your normal diet as instructed by your health care provider. Avoid heavy or fried foods that are hard to digest.  Avoid drinking alcohol for as long as instructed by your health care provider. Contact a health care provider if:  You have blood in your stool 2-3 days after the procedure. Get help right away if:  You have more than a small spotting of blood in your stool.  You pass large blood clots in your stool.  Your abdomen is swollen.  You have nausea or vomiting.  You have a fever.  You have increasing abdominal pain that is not relieved with medicine. This information is not intended to replace advice given to you by your health care provider. Make sure you discuss any questions you have with your health care provider. Document Released: 08/27/2003 Document Revised: 10/07/2015 Document Reviewed: 03/26/2015 Elsevier Interactive Patient Education  2018 [place].  Upper Endoscopy, Adult, Care After This sheet gives you information about how to care for yourself after your procedure. Your health care provider may also give you more specific instructions. If you have problems or questions, contact your health care provider. What can I expect after the procedure? After the procedure, it is common to have:  A sore throat.  Mild stomach pain or discomfort.  Bloating.  Nausea. Follow these instructions at home:   Follow instructions from your health care provider about what to eat or drink after your procedure.  Return to your normal activities  as told by your health care provider. Ask your health care provider what activities are safe for you.  Take over-the-counter and prescription medicines only as told by your health care provider.  Do not drive for 24 hours if you were given a sedative during your procedure.  Keep all follow-up visits as told by your health care provider. This is important. Contact a health care provider if you have:  A sore throat that lasts longer than one day.  Trouble swallowing. Get help right away if:  You vomit blood or your vomit looks like coffee grounds.  You have: ? A fever. ? Bloody, black, or tarry stools. ? A severe sore throat or you cannot swallow. ? Difficulty breathing. ? Severe pain in your chest or abdomen. Summary  After the procedure, it is common to have a sore throat, mild stomach discomfort, bloating, and nausea.  Do not drive for 24 hours if you were given a sedative during the procedure.  Follow instructions from your health care provider about what to eat or drink after your procedure.  Return to your normal activities as told by your health care provider. This information is not intended to replace advice given to you by your health care provider. Make sure you discuss any questions you have with your health care provider. Document Revised: 07/06/2017 Document Reviewed: 06/14/2017 Elsevier Patient Education  [place].

## 2019-09-25 ENCOUNTER — Encounter (HOSPITAL_COMMUNITY)
Admission: RE | Admit: 2019-09-25 | Discharge: 2019-09-25 | Disposition: A | Discharge status: Home / Self Care | Payer: BC Managed Care – PPO | Source: Ambulatory Visit | Attending: Internal Medicine | Admitting: Internal Medicine

## 2019-09-25 ENCOUNTER — Other Ambulatory Visit: Payer: Self-pay

## 2019-09-25 ENCOUNTER — Encounter (HOSPITAL_COMMUNITY): Payer: Self-pay

## 2019-09-25 ENCOUNTER — Other Ambulatory Visit (HOSPITAL_COMMUNITY)
Admission: RE | Admit: 2019-09-25 | Discharge: 2019-09-25 | Disposition: A | Discharge status: Home / Self Care | Payer: BC Managed Care – PPO | Source: Ambulatory Visit | Attending: Internal Medicine | Admitting: Internal Medicine

## 2019-09-25 DIAGNOSIS — Z01818 Encounter for other preprocedural examination: Secondary | ICD-10-CM | POA: Diagnosis not present

## 2019-09-25 DIAGNOSIS — I10 Essential (primary) hypertension: Secondary | ICD-10-CM | POA: Diagnosis not present

## 2019-09-25 DIAGNOSIS — Z20822 Contact with and (suspected) exposure to covid-19: Secondary | ICD-10-CM | POA: Insufficient documentation

## 2019-09-25 LAB — SARS CORONAVIRUS 2 (TAT 6-24 HRS): SARS Coronavirus 2: NEGATIVE

## 2019-09-26 ENCOUNTER — Other Ambulatory Visit: Payer: Self-pay

## 2019-09-26 ENCOUNTER — Ambulatory Visit (HOSPITAL_COMMUNITY)
Admission: RE | Admit: 2019-09-26 | Discharge: 2019-09-26 | Disposition: A | Discharge status: Home / Self Care | Payer: BC Managed Care – PPO | Attending: Internal Medicine | Admitting: Internal Medicine

## 2019-09-26 ENCOUNTER — Ambulatory Visit (HOSPITAL_COMMUNITY): Payer: BC Managed Care – PPO | Admitting: Anesthesiology

## 2019-09-26 ENCOUNTER — Encounter (HOSPITAL_COMMUNITY)
Admission: RE | Disposition: A | Discharge status: Home / Self Care | Payer: Self-pay | Source: Home / Self Care | Attending: Internal Medicine

## 2019-09-26 DIAGNOSIS — Z794 Long term (current) use of insulin: Secondary | ICD-10-CM | POA: Insufficient documentation

## 2019-09-26 DIAGNOSIS — Z1211 Encounter for screening for malignant neoplasm of colon: Secondary | ICD-10-CM | POA: Diagnosis not present

## 2019-09-26 DIAGNOSIS — E785 Hyperlipidemia, unspecified: Secondary | ICD-10-CM | POA: Insufficient documentation

## 2019-09-26 DIAGNOSIS — R1310 Dysphagia, unspecified: Secondary | ICD-10-CM | POA: Diagnosis not present

## 2019-09-26 DIAGNOSIS — Z79899 Other long term (current) drug therapy: Secondary | ICD-10-CM | POA: Insufficient documentation

## 2019-09-26 DIAGNOSIS — E119 Type 2 diabetes mellitus without complications: Secondary | ICD-10-CM | POA: Diagnosis not present

## 2019-09-26 DIAGNOSIS — R1314 Dysphagia, pharyngoesophageal phase: Secondary | ICD-10-CM | POA: Diagnosis not present

## 2019-09-26 DIAGNOSIS — I10 Essential (primary) hypertension: Secondary | ICD-10-CM | POA: Insufficient documentation

## 2019-09-26 DIAGNOSIS — K3189 Other diseases of stomach and duodenum: Secondary | ICD-10-CM | POA: Diagnosis not present

## 2019-09-26 DIAGNOSIS — K228 Other specified diseases of esophagus: Secondary | ICD-10-CM

## 2019-09-26 DIAGNOSIS — K621 Rectal polyp: Secondary | ICD-10-CM | POA: Diagnosis not present

## 2019-09-26 DIAGNOSIS — D122 Benign neoplasm of ascending colon: Secondary | ICD-10-CM | POA: Insufficient documentation

## 2019-09-26 DIAGNOSIS — K635 Polyp of colon: Secondary | ICD-10-CM | POA: Diagnosis not present

## 2019-09-26 DIAGNOSIS — R109 Unspecified abdominal pain: Secondary | ICD-10-CM | POA: Diagnosis not present

## 2019-09-26 DIAGNOSIS — K319 Disease of stomach and duodenum, unspecified: Secondary | ICD-10-CM | POA: Diagnosis not present

## 2019-09-26 DIAGNOSIS — K219 Gastro-esophageal reflux disease without esophagitis: Secondary | ICD-10-CM | POA: Insufficient documentation

## 2019-09-26 DIAGNOSIS — A630 Anogenital (venereal) warts: Secondary | ICD-10-CM | POA: Insufficient documentation

## 2019-09-26 HISTORY — PX: POLYPECTOMY: SHX5525

## 2019-09-26 HISTORY — PX: ESOPHAGOGASTRODUODENOSCOPY (EGD) WITH PROPOFOL: SHX5813

## 2019-09-26 HISTORY — PX: COLONOSCOPY WITH PROPOFOL: SHX5780

## 2019-09-26 HISTORY — PX: BIOPSY: SHX5522

## 2019-09-26 HISTORY — PX: MALONEY DILATION: SHX5535

## 2019-09-26 LAB — C DIFFICILE QUICK SCREEN W PCR REFLEX
C Diff antigen: NEGATIVE
C Diff interpretation: NOT DETECTED
C Diff toxin: NEGATIVE

## 2019-09-26 LAB — GLUCOSE, CAPILLARY: Glucose-Capillary: 146 mg/dL — ABNORMAL HIGH (ref 70–99)

## 2019-09-26 SURGERY — COLONOSCOPY WITH PROPOFOL
Anesthesia: General

## 2019-09-26 MED ORDER — LIDOCAINE HCL (CARDIAC) PF 50 MG/5ML IV SOSY
PREFILLED_SYRINGE | INTRAVENOUS | Status: DC | PRN
  Administered 2019-09-26: 100 mg via INTRAVENOUS

## 2019-09-26 MED ORDER — LACTATED RINGERS IV SOLN: Freq: Once | INTRAVENOUS | Status: AC

## 2019-09-26 MED ORDER — GLYCOPYRROLATE 0.2 MG/ML IJ SOLN
0.2000 mg | Freq: Once | INTRAMUSCULAR | Status: AC
  Administered 2019-09-26: 0.2 mg via INTRAVENOUS

## 2019-09-26 MED ORDER — STERILE WATER FOR IRRIGATION IR SOLN
Status: DC | PRN
  Administered 2019-09-26: 1.5 mL

## 2019-09-26 MED ORDER — PROPOFOL 10 MG/ML IV BOLUS
INTRAVENOUS | Status: AC
  Filled 2019-09-26: qty 20

## 2019-09-26 MED ORDER — CHLORHEXIDINE GLUCONATE CLOTH 2 % EX PADS: 6.0000 | MEDICATED_PAD | Freq: Once | CUTANEOUS | Status: DC

## 2019-09-26 MED ORDER — METOPROLOL TARTRATE 5 MG/5ML IV SOLN
INTRAVENOUS | Status: AC
  Filled 2019-09-26: qty 5

## 2019-09-26 MED ORDER — LACTATED RINGERS IV SOLN: INTRAVENOUS | Status: DC | PRN

## 2019-09-26 MED ORDER — LIDOCAINE VISCOUS HCL 2 % MT SOLN
OROMUCOSAL | Status: AC
  Filled 2019-09-26: qty 15

## 2019-09-26 MED ORDER — LIDOCAINE VISCOUS HCL 2 % MT SOLN
15.0000 mL | Freq: Once | OROMUCOSAL | Status: AC
  Administered 2019-09-26: 15 mL via OROMUCOSAL

## 2019-09-26 MED ORDER — GLYCOPYRROLATE 0.2 MG/ML IJ SOLN
INTRAMUSCULAR | Status: AC
  Filled 2019-09-26: qty 1

## 2019-09-26 MED ORDER — METOPROLOL TARTRATE 5 MG/5ML IV SOLN
INTRAVENOUS | Status: DC | PRN
  Administered 2019-09-26: 1 mg via INTRAVENOUS

## 2019-09-26 MED ORDER — LIDOCAINE 2% (20 MG/ML) 5 ML SYRINGE
INTRAMUSCULAR | Status: AC
  Filled 2019-09-26: qty 5

## 2019-09-26 MED ORDER — PROPOFOL 10 MG/ML IV BOLUS
INTRAVENOUS | Status: DC | PRN
  Administered 2019-09-26: 80 mg via INTRAVENOUS
  Administered 2019-09-26: 175 ug/kg/min via INTRAVENOUS
  Administered 2019-09-26: 40 mg via INTRAVENOUS
  Administered 2019-09-26: 50 mg via INTRAVENOUS

## 2019-09-26 NOTE — Op Note (Signed)
[facility] Patient Name: Benjamin Moreno Procedure Date: 09/26/2019 1:21 PM MRN: [id number] Date of Birth: 15-Jun-1955 Attending MD: Norvel Richards , MD CSN: [id number] Age: 64 Admit Type: Outpatient Procedure:                Colonoscopy Indications:              Screening for colorectal malignant neoplasm Providers:                Norvel Richards, MD, Janeece Riggers, RN, Lambert Mody, Casimer Bilis, Technician, Thomas Hoff., Technician Referring MD:              Medicines:                Propofol per Anesthesia Complications:            No immediate complications. Estimated Blood Loss:     Estimated blood loss was minimal. Procedure:                Pre-Anesthesia Assessment:                           - Prior to the procedure, a History and Physical                            was performed, and patient medications and                            allergies were reviewed. The patient's tolerance of                            previous anesthesia was also reviewed. The risks                            and benefits of the procedure and the sedation                            options and risks were discussed with the patient.                            All questions were answered, and informed consent                            was obtained. ASA Grade Assessment: II - A patient                            with mild systemic disease. After reviewing the                            risks and benefits, the patient was deemed in  satisfactory condition to undergo the procedure.                           After obtaining informed consent, the colonoscope                            was passed under direct vision. Throughout the                            procedure, the patient's blood pressure, pulse, and                            oxygen saturations were monitored continuously. The                             CF-[id number] ([id number]) scope was introduced through                            the anus and advanced to the 10 cm into the ileum.                            The colonoscopy was performed without difficulty.                            The patient tolerated the procedure well. The                            quality of the bowel preparation was adequate. Scope In: 1:23:57 PM Scope Out: 1:50:15 PM Scope Withdrawal Time: 0 hours 18 minutes 26 seconds  Total Procedure Duration: 0 hours 26 minutes 18 seconds  Findings:      Abnormal perianal exam; extensive perianal condyloma present. See photos.      Eight sessile polyps were found in the rectum and ascending colon. The       polyps were 3 to 8 mm in size. These polyps were removed with a cold       snare. Resection and retrieval were complete. Estimated blood loss was       minimal.      Retroflexion retroflexion view of the distal rectum revealed no       abnormalities. Stool sample submitted for GIP/C. difficile. Distal 10 cm       of TI appeared normal Impression:               - Abnormal perianal exam. Extensive perianal                            condyloma present                           - Eight 3 to 8 mm polyps in the rectum and in the                            ascending colon, removed with a cold snare.  Resected and retrieved. Normal terminal ileum.                            Status post stool collection Moderate Sedation:      Moderate (conscious) sedation was personally administered by an       anesthesia professional. The following parameters were monitored: oxygen       saturation, heart rate, blood pressure, respiratory rate, EKG, adequacy       of pulmonary ventilation, and response to care. Recommendation:           - Patient has a contact number available for                            emergencies. The signs and symptoms of potential                            delayed complications  were discussed with the                            patient. Return to normal activities tomorrow.                            Written discharge instructions were provided to the                            patient.                           - Advance diet as tolerated. Follow-up on                            pathology, lab studies. See EGD report. Patient                            should keep his follow-up appoint with Dr. Constance Haw.                           - Repeat colonoscopy date to be determined after                            pending pathology results are reviewed for                            surveillance.                           - Return to GI office (date not yet determined). Procedure Code(s):        --- Professional ---                           [phone], Colonoscopy, flexible; with removal of                            tumor(s), polyp(s), or other lesion(s) by snare  technique Diagnosis Code(s):        --- Professional ---                           Z12.11, Encounter for screening for malignant                            neoplasm of colon                           K62.1, Rectal polyp                           K63.5, Polyp of colon CPT copyright 2019 American Medical Association. All rights reserved. The codes documented in this report are preliminary and upon coder review may  be revised to meet current compliance requirements. [name]. Letanya Froh, MD Norvel Richards, MD 09/26/2019 2:03:26 PM This report has been signed electronically. Number of Addenda: 0

## 2019-09-26 NOTE — Discharge Instructions (Signed)
Colon Polyps  Polyps are tissue growths inside the body. Polyps can grow in many places, including the large intestine (colon). A polyp may be a round bump or a mushroom-shaped growth. You could have one polyp or several. Most colon polyps are noncancerous (benign). However, some colon polyps can become cancerous over time. Finding and removing the polyps early can help prevent this. What are the causes? The exact cause of colon polyps is not known. What increases the risk? You are more likely to develop this condition if you:  Have a family history of colon cancer or colon polyps.  Are older than 55 or older than 45 if you are African American.  Have inflammatory bowel disease, such as ulcerative colitis or Crohn's disease.  Have certain hereditary conditions, such as: ? Familial adenomatous polyposis. ? Lynch syndrome. ? Turcot syndrome. ? Peutz-Jeghers syndrome.  Are overweight.  Smoke cigarettes.  Do not get enough exercise.  Drink too much alcohol.  Eat a diet that is high in fat and red meat and low in fiber.  Had childhood cancer that was treated with abdominal radiation. What are the signs or symptoms? Most polyps do not cause symptoms. If you have symptoms, they may include:  Blood coming from your rectum when having a bowel movement.  Blood in your stool. The stool may look dark red or black.  Abdominal pain.  A change in bowel habits, such as constipation or diarrhea. How is this diagnosed? This condition is diagnosed with a colonoscopy. This is a procedure in which a lighted, flexible scope is inserted into the anus and then passed into the colon to examine the area. Polyps are sometimes found when a colonoscopy is done as part of routine cancer screening tests. How is this treated? Treatment for this condition involves removing any polyps that are found. Most polyps can be removed during a colonoscopy. Those polyps will then be tested for cancer. Additional  treatment may be needed depending on the results of testing. Follow these instructions at home: Lifestyle  Maintain a healthy weight, or lose weight if recommended by your health care provider.  Exercise every day or as told by your health care provider.  Do not use any products that contain nicotine or tobacco, such as cigarettes and e-cigarettes. If you need help quitting, ask your health care provider.  If you drink alcohol, limit how much you have: ? 0-1 drink a day for women. ? 0-2 drinks a day for men.  Be aware of how much alcohol is in your drink. In the [place] one drink equals one 12 oz bottle of beer (355 mL), one 5 oz glass of wine (148 mL), or one 1 oz shot of hard liquor (44 mL). Eating and drinking   Eat foods that are high in fiber, such as fruits, vegetables, and whole grains.  Eat foods that are high in calcium and vitamin D, such as milk, cheese, yogurt, eggs, liver, fish, and broccoli.  Limit foods that are high in fat, such as fried foods and desserts.  Limit the amount of red meat and processed meat you eat, such as hot dogs, sausage, bacon, and lunch meats. General instructions  Keep all follow-up visits as told by your health care provider. This is important. ? This includes having regularly scheduled colonoscopies. ? Talk to your health care provider about when you need a colonoscopy. Contact a health care provider if:  You have new or worsening bleeding during a bowel movement.  You  have new or increased blood in your stool.  You have a change in bowel habits.  You lose weight for no known reason. Summary  Polyps are tissue growths inside the body. Polyps can grow in many places, including the colon.  Most colon polyps are noncancerous (benign), but some can become cancerous over time.  This condition is diagnosed with a colonoscopy.  Treatment for this condition involves removing any polyps that are found. Most polyps can be removed during a  colonoscopy. This information is not intended to replace advice given to you by your health care provider. Make sure you discuss any questions you have with your health care provider. Document Revised: 04/29/2017 Document Reviewed: 04/29/2017 Elsevier Patient Education  [place].  Colonoscopy Discharge Instructions  Read the instructions outlined below and refer to this sheet in the next few weeks. These discharge instructions provide you with general information on caring for yourself after you leave the hospital. Your doctor may also give you specific instructions. While your treatment has been planned according to the most current medical practices available, unavoidable complications occasionally occur. If you have any problems or questions after discharge, call Dr. Gala Romney at [phone]. ACTIVITY  You may resume your regular activity, but move at a slower pace for the next 24 hours.   Take frequent rest periods for the next 24 hours.   Walking will help get rid of the air and reduce the bloated feeling in your belly (abdomen).   No driving for 24 hours (because of the medicine (anesthesia) used during the test).    Do not sign any important legal documents or operate any machinery for 24 hours (because of the anesthesia used during the test).  NUTRITION  Drink plenty of fluids.   You may resume your normal diet as instructed by your doctor.   Begin with a light meal and progress to your normal diet. Heavy or fried foods are harder to digest and may make you feel sick to your stomach (nauseated).   Avoid alcoholic beverages for 24 hours or as instructed.  MEDICATIONS  You may resume your normal medications unless your doctor tells you otherwise.  WHAT YOU CAN EXPECT TODAY  Some feelings of bloating in the abdomen.   Passage of more gas than usual.   Spotting of blood in your stool or on the toilet paper.  IF YOU HAD POLYPS REMOVED DURING THE COLONOSCOPY:  No aspirin  products for 7 days or as instructed.   No alcohol for 7 days or as instructed.   Eat a soft diet for the next 24 hours.  FINDING OUT THE RESULTS OF YOUR TEST Not all test results are available during your visit. If your test results are not back during the visit, make an appointment with your caregiver to find out the results. Do not assume everything is normal if you have not heard from your caregiver or the medical facility. It is important for you to follow up on all of your test results.  SEEK IMMEDIATE MEDICAL ATTENTION IF:  You have more than a spotting of blood in your stool.   Your belly is swollen (abdominal distention).   You are nauseated or vomiting.   You have a temperature over 101.   You have abdominal pain or discomfort that is severe or gets worse throughout the day.    EGD Discharge instructions Please read the instructions outlined below and refer to this sheet in the next few weeks. These discharge instructions  provide you with general information on caring for yourself after you leave the hospital. Your doctor may also give you specific instructions. While your treatment has been planned according to the most current medical practices available, unavoidable complications occasionally occur. If you have any problems or questions after discharge, please call your doctor. ACTIVITY  You may resume your regular activity but move at a slower pace for the next 24 hours.   Take frequent rest periods for the next 24 hours.   Walking will help expel (get rid of) the air and reduce the bloated feeling in your abdomen.   No driving for 24 hours (because of the anesthesia (medicine) used during the test).   You may shower.   Do not sign any important legal documents or operate any machinery for 24 hours (because of the anesthesia used during the test).  NUTRITION  Drink plenty of fluids.   You may resume your normal diet.   Begin with a light meal and progress to  your normal diet.   Avoid alcoholic beverages for 24 hours or as instructed by your caregiver.  MEDICATIONS  You may resume your normal medications unless your caregiver tells you otherwise.  WHAT YOU CAN EXPECT TODAY  You may experience abdominal discomfort such as a feeling of fullness or "gas" pains.  FOLLOW-UP  Your doctor will discuss the results of your test with you.  SEEK IMMEDIATE MEDICAL ATTENTION IF ANY OF THE FOLLOWING OCCUR:  Excessive nausea (feeling sick to your stomach) and/or vomiting.   Severe abdominal pain and distention (swelling).   Trouble swallowing.   Temperature over 101 F (37.8 C).   Rectal bleeding or vomiting of blood.   8 polyps removed from your colon.  Further recommendations to follow pending review of pathology report  Would see a dermatologist for guarding the abnormal skin around your anus  Your esophagus was stretched today.  Your stomach was biopsied.  Follow-up with Dr. Constance Haw as directed  At patient request, I called Christia Coaxum at [phone] - "all circuits busy"

## 2019-09-26 NOTE — Anesthesia Preprocedure Evaluation (Addendum)
Anesthesia Evaluation  Patient identified by MRN, date of birth, ID band Patient awake    Reviewed: Allergy & Precautions, NPO status , Patient's Chart, lab work & pertinent test results  History of Anesthesia Complications Negative for: history of anesthetic complications  Airway Mallampati: II  TM Distance: >3 FB Neck ROM: Full    Dental  (+) Edentulous Upper, Edentulous Lower   Pulmonary pneumonia, resolved,    Pulmonary exam normal breath sounds clear to auscultation       Cardiovascular METS: 3 - Mets hypertension, Pt. on medications Normal cardiovascular exam Rhythm:Regular Rate:Normal  25-Sep-2019 08:38:51 [place] [facility]-OPS ROUTINE RECORD Sinus rhythm Left axis deviation Low voltage QRS Abnormal ECG Confirmed by Asencion Noble [phone]) on 09/25/2019 6:22:44 PM   Neuro/Psych negative neurological ROS  negative psych ROS   GI/Hepatic GERD  Medicated and Controlled,  Endo/Other  diabetes, Well Controlled, Type 2, Insulin Dependent  Renal/GU Renal InsufficiencyRenal disease     Musculoskeletal   Abdominal   Peds  Hematology   Anesthesia Other Findings   Reproductive/Obstetrics                           Anesthesia Physical Anesthesia Plan  ASA: III  Anesthesia Plan: General   Post-op Pain Management:    Induction: Intravenous  PONV Risk Score and Plan: TIVA  Airway Management Planned: Nasal Cannula and Natural Airway  Additional Equipment:   Intra-op Plan:   Post-operative Plan:   Informed Consent: I have reviewed the patients History and Physical, chart, labs and discussed the procedure including the risks, benefits and alternatives for the proposed anesthesia with the patient or authorized representative who has indicated his/her understanding and acceptance.       Plan Discussed with: CRNA and Surgeon  Anesthesia Plan Comments:        Anesthesia  Quick Evaluation

## 2019-09-26 NOTE — Anesthesia Postprocedure Evaluation (Signed)
Anesthesia Post Note  Patient: Scherrie Merritts  Procedure(s) Performed: COLONOSCOPY WITH PROPOFOL (N/A ) ESOPHAGOGASTRODUODENOSCOPY (EGD) WITH PROPOFOL (N/A ) BIOPSY MALONEY DILATION POLYPECTOMY  Patient location during evaluation: PACU Anesthesia Type: General Level of consciousness: awake, oriented, awake and alert and patient cooperative Pain management: pain level controlled Vital Signs Assessment: post-procedure vital signs reviewed and stable Respiratory status: spontaneous breathing, respiratory function stable and nonlabored ventilation Cardiovascular status: blood pressure returned to baseline Postop Assessment: no headache and no backache Anesthetic complications: no   No complications documented.   Last Vitals: There were no vitals filed for this visit.  Last Pain:  Vitals:   09/26/19 1305  PainSc: 0-No pain                 Tacy Learn

## 2019-09-26 NOTE — H&P (Signed)
@LOGO @   Primary Care Physician:  Asencion Noble, MD Primary Gastroenterologist:  Dr. Gala Romney  Pre-Procedure History & Physical: HPI:  Benjamin Moreno is a 64 y.o. male here for further evaluation of esophageal dysphagia via EGD and first ever screening colonoscopy.  Past Medical History:  Diagnosis Date  . Bowel obstruction ([place])   . Diabetes mellitus   . GERD (gastroesophageal reflux disease)   . HLD (hyperlipidemia)   . Hypertension     Past Surgical History:  Procedure Laterality Date  . CATARACT EXTRACTION      Prior to Admission medications   Medication Sig Start Date End Date Taking? Authorizing Provider  amLODipine (NORVASC) 10 MG tablet Take 1 tablet (10 mg total) by mouth daily. 07/27/19  Yes Emokpae, Courage, MD  atorvastatin (LIPITOR) 20 MG tablet Take 20 mg by mouth daily. 07/19/19  Yes [provider]  empagliflozin (JARDIANCE) 25 MG TABS tablet Take 25 mg by mouth daily.   Yes [provider]  glimepiride (AMARYL) 2 MG tablet Take 2 mg by mouth every morning. 07/07/19  Yes [provider]  imiquimod (ALDARA) 5 % cream Apply 1 application topically at bedtime. 09/05/19  Yes [provider]  LANTUS SOLOSTAR 100 UNIT/ML Solostar Pen Inject 45 Units into the skin every morning. 06/27/19  Yes [provider]  metFORMIN (GLUCOPHAGE) 500 MG tablet Take 500 mg by mouth 2 (two) times daily.   Yes [provider]  metoprolol succinate (TOPROL-XL) 100 MG 24 hr tablet Take 1 tablet (100 mg total) by mouth daily. Take with or immediately following a meal. 07/27/19  Yes Emokpae, Courage, MD  ondansetron (ZOFRAN ODT) 4 MG disintegrating tablet Take 1 tablet (4 mg total) by mouth every 8 (eight) hours as needed for nausea or vomiting. 07/27/19  Yes Emokpae, Courage, MD  pantoprazole (PROTONIX) 40 MG tablet Take 1 tablet (40 mg total) by mouth daily. 07/27/19  Yes Emokpae, Courage, MD  ramipril (ALTACE) 10 MG capsule Take 1 capsule (10 mg total) by  mouth daily. 07/27/19  Yes Emokpae, Courage, MD  INVOKANA 300 MG TABS tablet Take 300 mg by mouth daily. Patient not taking: Reported on 09/19/2019 07/19/19   [provider]    Allergies as of 08/23/2019  . (No Known Allergies)    Family History  Problem Relation Age of Onset  . Cancer Mother   . Stomach cancer Mother   . Heart disease Father   . Bowel Disease Paternal Grandmother        Patient isn't sure the etiology. Required colostomy.   . Stomach cancer Maternal Uncle        several uncles with stomach cancer  . Colon cancer Neg Hx   . Inflammatory bowel disease Neg Hx     Social History   Socioeconomic History  . Marital status: Married    Spouse name: Not on file  . Number of children: Not on file  . Years of education: Not on file  . Highest education level: Not on file  Occupational History  . Not on file  Tobacco Use  . Smoking status: Never Smoker  . Smokeless tobacco: Never Used  Vaping Use  . Vaping Use: Never used  Substance and Sexual Activity  . Alcohol use: No  . Drug use: No  . Sexual activity: Yes  Other Topics Concern  . Not on file  Social History Narrative  . Not on file   Social Determinants of Health   Financial Resource Strain:   .  Difficulty of Paying Living Expenses: Not on file  Food Insecurity:   . Worried About Charity fundraiser in the Last Year: Not on file  . Ran Out of Food in the Last Year: Not on file  Transportation Needs:   . Lack of Transportation (Medical): Not on file  . Lack of Transportation (Non-Medical): Not on file  Physical Activity:   . Days of Exercise per Week: Not on file  . Minutes of Exercise per Session: Not on file  Stress:   . Feeling of Stress : Not on file  Social Connections:   . Frequency of Communication with Friends and Family: Not on file  . Frequency of Social Gatherings with Friends and Family: Not on file  . Attends Religious Services: Not on file  . Active Member of Clubs or  Organizations: Not on file  . Attends Archivist Meetings: Not on file  . Marital Status: Not on file  Intimate Partner Violence:   . Fear of Current or Ex-Partner: Not on file  . Emotionally Abused: Not on file  . Physically Abused: Not on file  . Sexually Abused: Not on file    Review of Systems: See HPI, otherwise negative ROS  Physical Exam: There were no vitals taken for this visit. General:   Alert,  Well-developed, well-nourished, pleasant and cooperative in NAD Neck:  Supple; no masses or thyromegaly. No significant cervical adenopathy. Lungs:  Clear throughout to auscultation.   No wheezes, crackles, or rhonchi. No acute distress. Heart:  Regular rate and rhythm; no murmurs, clicks, rubs,  or gallops. Abdomen: Non-distended, normal bowel sounds.  Soft and nontender without appreciable mass or hepatosplenomegaly.  Pulses:  Normal pulses noted. Extremities:  Without clubbing or edema.  Impression/Plan: 63 year old gent with esophageal dysphagia and no prior colonoscopy.  Phonic abdominal pain for which diagnostic laparoscopy is being contemplated by Dr. Constance Haw.  I have offered the patient an EGD with possible esophageal dilation and screening colonoscopy today per plan.  The risks, benefits, limitations, imponderables and alternatives regarding both EGD and colonoscopy have been reviewed with the patient. Questions have been answered. All parties agreeable.      Notice: This dictation was prepared with Dragon dictation along with smaller phrase technology. Any transcriptional errors that result from this process are unintentional and may not be corrected upon review.

## 2019-09-26 NOTE — Transfer of Care (Signed)
Immediate Anesthesia Transfer of Care Note  Patient: Benjamin Moreno  Procedure(s) Performed: COLONOSCOPY WITH PROPOFOL (N/A ) ESOPHAGOGASTRODUODENOSCOPY (EGD) WITH PROPOFOL (N/A ) BIOPSY MALONEY DILATION POLYPECTOMY  Patient Location: PACU  Anesthesia Type:General  Level of Consciousness: awake, alert , oriented and patient cooperative  Airway & Oxygen Therapy: Patient Spontanous Breathing and Patient connected to nasal cannula oxygen  Post-op Assessment: Report given to RN, Post -op Vital signs reviewed and stable and Patient moving all extremities  Post vital signs: Reviewed and stable  Last Vitals:  Vitals Value Taken Time  BP 116/71 09/26/19 1353  Temp    Pulse 94 09/26/19 1354  Resp 18 09/26/19 1354  SpO2 95 % 09/26/19 1354  Vitals shown include unvalidated device data.  Last Pain:  Vitals:   09/26/19 1305  PainSc: 0-No pain         Complications: No complications documented.

## 2019-09-26 NOTE — Op Note (Signed)
[facility] Patient Name: Benjamin Moreno Procedure Date: 09/26/2019 12:49 PM MRN: [id number] Date of Birth: 09-25-1955 Attending MD: Norvel Richards , MD CSN: [id number] Age: 64 Admit Type: Outpatient Procedure:                Upper GI endoscopy Indications:              Dysphagia Providers:                Norvel Richards, MD, Janeece Riggers, RN, Lambert Mody, Casimer Bilis, Technician, Aram Candela Referring MD:              Medicines:                Propofol per Anesthesia Complications:            No immediate complications. Estimated Blood Loss:     Estimated blood loss was minimal. Procedure:                Pre-Anesthesia Assessment:                           - Prior to the procedure, a History and Physical                            was performed, and patient medications and                            allergies were reviewed. The patient's tolerance of                            previous anesthesia was also reviewed. The risks                            and benefits of the procedure and the sedation                            options and risks were discussed with the patient.                            All questions were answered, and informed consent                            was obtained. ASA Grade Assessment: II - A patient                            with mild systemic disease. After reviewing the                            risks and benefits, the patient was deemed in  satisfactory condition to undergo the procedure.                           After obtaining informed consent, the endoscope was                            passed under direct vision. Throughout the                            procedure, the patient's blood pressure, pulse, and                            oxygen saturations were monitored continuously. The                            GIF-H190 ([id number]) scope was  introduced through the                            mouth, and advanced to the second part of duodenum.                            The upper GI endoscopy was accomplished without                            difficulty. The patient tolerated the procedure                            well. Scope In: 1:08:43 PM Scope Out: 1:17:08 PM Total Procedure Duration: 0 hours 8 minutes 25 seconds  Findings:      Localized mild mucosal changes single 1 cm "island" of salmon-colored       epithelium just above the GE junction. No nodularity. No ring stricture       or neoplasm seen. Tubular esophagus easily traversed with the adult       scope.      Antral erosions and nodularity noted. No ulcer or infiltrating process       seen. Patent pylorus.      The duodenal bulb and second portion of the duodenum were normal. The       scope was withdrawn. Dilation was performed with a Maloney dilator with       mild resistance at 56 Fr. The dilation site was examined following       endoscope reinsertion and showed no change. Estimated blood loss was       minimal. Finally, the abnormal distal esophagus and antrum were biopsied       separately. Impression:               - Mucosal changes in the esophagus. Dilated and                            biopsied. Gastric antral nodularity?"status post                            biopsy                           -  Normal duodenal bulb and second portion of the                            duodenum. Moderate Sedation:      Moderate (conscious) sedation was personally administered by an       anesthesia professional. The following parameters were monitored: oxygen       saturation, heart rate, blood pressure, respiratory rate, EKG, adequacy       of pulmonary ventilation, and response to care. Recommendation:           - Patient has a contact number available for                            emergencies. The signs and symptoms of potential                            delayed  complications were discussed with the                            patient. Return to normal activities tomorrow.                            Written discharge instructions were provided to the                            patient.                           - Advance diet as tolerated. See colonoscopy report. Procedure Code(s):        --- Professional ---                           [phone], Esophagogastroduodenoscopy, flexible,                            transoral; diagnostic, including collection of                            specimen(s) by brushing or washing, when performed                            (separate procedure)                           43450, Dilation of esophagus, by unguided sound or                            bougie, single or multiple passes Diagnosis Code(s):        --- Professional ---                           K22.8, Other specified diseases of esophagus                           R13.10, Dysphagia, unspecified CPT copyright 2019 American Medical Association. All rights reserved. The codes documented in this report are  preliminary and upon coder review may  be revised to meet current compliance requirements. [name]. [name], MD Norvel Richards, MD 09/26/2019 1:56:29 PM This report has been signed electronically. Number of Addenda: 0

## 2019-09-27 ENCOUNTER — Other Ambulatory Visit: Payer: Self-pay

## 2019-09-27 ENCOUNTER — Encounter (HOSPITAL_COMMUNITY): Payer: Self-pay | Admitting: Internal Medicine

## 2019-09-27 LAB — GASTROINTESTINAL PANEL BY PCR, STOOL (REPLACES STOOL CULTURE)

## 2019-09-27 LAB — SURGICAL PATHOLOGY

## 2019-10-05 ENCOUNTER — Encounter: Payer: Self-pay | Admitting: General Surgery

## 2019-10-05 ENCOUNTER — Ambulatory Visit (INDEPENDENT_AMBULATORY_CARE_PROVIDER_SITE_OTHER): Payer: BC Managed Care – PPO | Admitting: General Surgery

## 2019-10-05 ENCOUNTER — Telehealth: Payer: Self-pay

## 2019-10-05 ENCOUNTER — Encounter: Payer: Self-pay | Admitting: Internal Medicine

## 2019-10-05 ENCOUNTER — Other Ambulatory Visit: Payer: Self-pay

## 2019-10-05 VITALS — BP 147/83 | HR 77 | Temp 98.5°F | Resp 16 | Ht 64.5 in | Wt 247.0 lb

## 2019-10-05 DIAGNOSIS — K5989 Other specified functional intestinal disorders: Secondary | ICD-10-CM | POA: Diagnosis not present

## 2019-10-05 DIAGNOSIS — K56699 Other intestinal obstruction unspecified as to partial versus complete obstruction: Secondary | ICD-10-CM | POA: Diagnosis not present

## 2019-10-05 DIAGNOSIS — A09 Infectious gastroenteritis and colitis, unspecified: Secondary | ICD-10-CM | POA: Diagnosis not present

## 2019-10-05 DIAGNOSIS — K529 Noninfective gastroenteritis and colitis, unspecified: Secondary | ICD-10-CM

## 2019-10-05 NOTE — Telephone Encounter (Signed)
Letter mailed to the patient

## 2019-10-05 NOTE — Telephone Encounter (Signed)
Per Dr.Rourk- Send letter to patient.  Send copy of letter with path to referring provider and PCP.   Pt needs a f/u appt ref GI issues in 2 months if not already scheduled.

## 2019-10-05 NOTE — Progress Notes (Signed)
[facility] History and Physical  Reason for Referral:? Exploratory laparoscopy  Referring Physician:  GI   Chief Complaint    New Patient (Initial Visit)      Benjamin Moreno is a 64 y.o. male.  HPI: Benjamin Moreno is a 64 yo who is known to me after a consult in the hospital fir SBO that was deemed to likely be pseudo-obstruction from thickening of the small intestine that was prominent on the admission CT and prior CT. I had reviewed the CT with radiology and recommended further workup with GI. He has seen GI and had EGD and colonoscopy and celiac serologies, ANA, sed rate, stool studies done that were all normal. Aside from his 2 bowel obstructions he normally has diarrhea chronically. He has multiple loose stools a day. 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  He was referred back to me as I mentioned in my note possible exploratory laparoscopy in the future if all other measures exhausted.  He had some polyps removed on his colonoscopy and normal terminal ileum. His EGD had some mucosal changes and normal duodenum.  He has not had any investigation otherwise of his remaining small intestine/ no capsule study etc. He is not currently obstructed. He has never had surgeries and his CT does not show any evidence that scar tissue is causing this as it is thickened small bowel with fecalization.   He says he has abdominal pain with food and that this is intermittent and not predictive.  He has not lost any weight and is able to eat.   Past Medical History:  Diagnosis Date  . Bowel obstruction ([place])   . Diabetes mellitus   . GERD (gastroesophageal reflux disease)   . HLD (hyperlipidemia)   . Hypertension     Past Surgical History:  Procedure Laterality Date  . BIOPSY  09/26/2019   Procedure: BIOPSY;  Surgeon: Daneil Dolin, MD;  [facility];  Service: Endoscopy;;  . CATARACT EXTRACTION    . COLONOSCOPY WITH  PROPOFOL N/A 09/26/2019   Procedure: COLONOSCOPY WITH PROPOFOL;  Surgeon: Daneil Dolin, MD;  [facility];  Service: Endoscopy;  Laterality: N/A;  1:45pm  . ESOPHAGOGASTRODUODENOSCOPY (EGD) WITH PROPOFOL N/A 09/26/2019   Procedure: ESOPHAGOGASTRODUODENOSCOPY (EGD) WITH PROPOFOL;  Surgeon: Daneil Dolin, MD;  [facility];  Service: Endoscopy;  Laterality: N/A;  Venia Minks DILATION  09/26/2019   Procedure: Venia Minks DILATION;  Surgeon: Daneil Dolin, MD;  [facility];  Service: Endoscopy;;  . POLYPECTOMY  09/26/2019   Procedure: POLYPECTOMY;  Surgeon: Daneil Dolin, MD;  [facility];  Service: Endoscopy;;    Family History  Problem Relation Age of Onset  . Cancer Mother   . Stomach cancer Mother   . Heart disease Father   . Bowel Disease Paternal Grandmother        Patient isn't sure the etiology. Required colostomy.   . Stomach cancer Maternal Uncle        several uncles with stomach cancer  . Colon cancer Neg Hx   . Inflammatory bowel disease Neg Hx     Social History   Tobacco Use  . Smoking status: Never Smoker  . Smokeless tobacco: Never Used  Vaping Use  . Vaping Use: Never used  Substance Use Topics  . Alcohol use: No  . Drug use: No    Medications: I have reviewed the patient's  current medications. Allergies as of 10/05/2019   No Known Allergies     Medication List       Accurate as of October 05, 2019  4:57 PM. If you have any questions, ask your nurse or doctor.        amLODipine 10 MG tablet Commonly known as: NORVASC Take 1 tablet (10 mg total) by mouth daily.   atorvastatin 20 MG tablet Commonly known as: LIPITOR Take 20 mg by mouth daily.   glimepiride 2 MG tablet Commonly known as: AMARYL Take 2 mg by mouth every morning.   imiquimod 5 % cream Commonly known as: ALDARA Apply 1 application topically at bedtime.   Jardiance 25 MG Tabs tablet Generic drug: empagliflozin Take 25 mg by mouth  daily.   Lantus SoloStar 100 UNIT/ML Solostar Pen Generic drug: insulin glargine Inject 45 Units into the skin every morning.   metFORMIN 500 MG tablet Commonly known as: GLUCOPHAGE Take 500 mg by mouth 2 (two) times daily.   metoprolol succinate 100 MG 24 hr tablet Commonly known as: TOPROL-XL Take 1 tablet (100 mg total) by mouth daily. Take with or immediately following a meal.   ondansetron 4 MG disintegrating tablet Commonly known as: Zofran ODT Take 1 tablet (4 mg total) by mouth every 8 (eight) hours as needed for nausea or vomiting.   pantoprazole 40 MG tablet Commonly known as: PROTONIX Take 1 tablet (40 mg total) by mouth daily.   ramipril 10 MG capsule Commonly known as: ALTACE Take 1 capsule (10 mg total) by mouth daily.        ROS:  A comprehensive review of systems was negative except for: Gastrointestinal: positive for abdominal pain and post prandial abdominal pain, diarrhea  Blood pressure (!) 147/83, pulse 77, temperature 98.5 F (36.9 C), temperature source Oral, resp. rate 16, height 5' 4.5" (1.638 m), weight 247 lb (112 kg), SpO2 94 %. Physical Exam Vitals reviewed.  Constitutional:      Appearance: He is obese.  HENT:     Head: Normocephalic.     Nose: Nose normal.  Eyes:     Pupils: Pupils are equal, round, and reactive to light.  Cardiovascular:     Rate and Rhythm: Normal rate and regular rhythm.  Pulmonary:     Effort: Pulmonary effort is normal.     Breath sounds: Normal breath sounds.  Abdominal:     General: There is no distension.     Palpations: Abdomen is soft.     Tenderness: There is abdominal tenderness.  Musculoskeletal:        General: Normal range of motion.  Skin:    General: Skin is warm and dry.  Neurological:     General: No focal deficit present.     Mental Status: He is alert and oriented to person, place, and time.  Psychiatric:        Mood and Affect: Mood normal.        Behavior: Behavior normal.         Thought Content: Thought content normal.        Judgment: Judgment normal.     Results: CT a/p admission 06/2019-personally reviewed and reviewed again with Dr. Thornton Papas- thickened wall of dilated small bowel, fecalized small bowel   Assessment & Plan:  Benjamin Moreno is a 64 y.o. male with thickened small intestine of unknown etiology and chronic abdominal pain and diarrhea. He has no surgical history and at this time no strong indication for exploring  as he is not obstructed and again could have other pathology that would not be helped by Surgery. I have been very frank with patient and wife that this needs to be investigated further and that surgery would be a last option.  I also discussed that chronic abdominal pain is a complex and complicated issue and that there is a brain/ gut phenomena that we do not entirely understand. I told them that we may never know why he has these issues. I did tell them about GI specialist who deal with functional abdominal pain if he needs to go that route in the future (Dr. Benita Stabile in [place]).   -Discussed CT enterography with Dr. Thornton Papas, will start with this and I think that he would likely benefit from a capsule study as well. Will send GI a message. -No plan for exploratory surgery at this time as the risk out weigh the benefits.   All questions were answered to the satisfaction of the patient and family.   Benjamin Moreno 10/05/2019, 4:57 PM

## 2019-10-05 NOTE — Patient Instructions (Signed)
Will follow up about possible CT to further assess. Will discuss further with GI, possible small bowel capsule study?   Benita Stabile, MD - GI Specialist on IBS/ functional bowel issues [email or web address]

## 2019-10-06 NOTE — Telephone Encounter (Signed)
done

## 2019-10-09 ENCOUNTER — Telehealth: Payer: Self-pay | Admitting: General Surgery

## 2019-10-09 NOTE — Telephone Encounter (Signed)
[facility]  Discussed with Radiology, plan for CT enterography. Left patient message about ordering this and someone will be in touch with scheduling.  Curlene Labrum, MD [facility] [address] [place], [place] [address] [phone] (office)

## 2019-10-11 DIAGNOSIS — A630 Anogenital (venereal) warts: Secondary | ICD-10-CM | POA: Diagnosis not present

## 2019-10-11 NOTE — Telephone Encounter (Signed)
Called [facility] for PA and spoke to [place].  - does not require PA - reference # for call [phone]. Spoke to pt and pt's wife and informed of CT scan appointment.

## 2019-10-24 DIAGNOSIS — I10 Essential (primary) hypertension: Secondary | ICD-10-CM | POA: Diagnosis not present

## 2019-10-24 DIAGNOSIS — Z79899 Other long term (current) drug therapy: Secondary | ICD-10-CM | POA: Diagnosis not present

## 2019-10-24 DIAGNOSIS — Z6841 Body Mass Index (BMI) 40.0 and over, adult: Secondary | ICD-10-CM | POA: Diagnosis not present

## 2019-10-24 DIAGNOSIS — A630 Anogenital (venereal) warts: Secondary | ICD-10-CM | POA: Diagnosis not present

## 2019-10-24 DIAGNOSIS — E119 Type 2 diabetes mellitus without complications: Secondary | ICD-10-CM | POA: Diagnosis not present

## 2019-10-24 DIAGNOSIS — Z7984 Long term (current) use of oral hypoglycemic drugs: Secondary | ICD-10-CM | POA: Diagnosis not present

## 2019-10-30 ENCOUNTER — Ambulatory Visit (HOSPITAL_COMMUNITY): Payer: BC Managed Care – PPO

## 2019-11-08 DIAGNOSIS — A630 Anogenital (venereal) warts: Secondary | ICD-10-CM | POA: Diagnosis not present

## 2019-11-22 ENCOUNTER — Ambulatory Visit (HOSPITAL_COMMUNITY)
Admission: RE | Admit: 2019-11-22 | Discharge: 2019-11-22 | Disposition: A | Discharge status: Home / Self Care | Payer: BC Managed Care – PPO | Source: Ambulatory Visit | Attending: General Surgery | Admitting: General Surgery

## 2019-11-22 ENCOUNTER — Other Ambulatory Visit: Payer: Self-pay

## 2019-11-22 DIAGNOSIS — K5730 Diverticulosis of large intestine without perforation or abscess without bleeding: Secondary | ICD-10-CM | POA: Diagnosis not present

## 2019-11-22 DIAGNOSIS — A09 Infectious gastroenteritis and colitis, unspecified: Secondary | ICD-10-CM | POA: Diagnosis not present

## 2019-11-22 DIAGNOSIS — K529 Noninfective gastroenteritis and colitis, unspecified: Secondary | ICD-10-CM | POA: Diagnosis not present

## 2019-11-22 DIAGNOSIS — K56699 Other intestinal obstruction unspecified as to partial versus complete obstruction: Secondary | ICD-10-CM | POA: Insufficient documentation

## 2019-11-22 DIAGNOSIS — I700 Atherosclerosis of aorta: Secondary | ICD-10-CM | POA: Diagnosis not present

## 2019-11-22 DIAGNOSIS — K5989 Other specified functional intestinal disorders: Secondary | ICD-10-CM | POA: Diagnosis not present

## 2019-11-22 LAB — POCT I-STAT CREATININE: Creatinine, Ser: 1 mg/dL (ref 0.61–1.24)

## 2019-11-22 MED ORDER — IOHEXOL 300 MG/ML  SOLN: 100.0000 mL | Freq: Once | INTRAMUSCULAR | Status: DC | PRN

## 2019-11-22 MED ORDER — IOHEXOL 300 MG/ML  SOLN
150.0000 mL | Freq: Once | INTRAMUSCULAR | Status: AC | PRN
  Administered 2019-11-22: 125 mL via INTRAVENOUS

## 2019-11-23 ENCOUNTER — Telehealth (INDEPENDENT_AMBULATORY_CARE_PROVIDER_SITE_OTHER): Payer: BC Managed Care – PPO | Admitting: General Surgery

## 2019-11-23 DIAGNOSIS — K56699 Other intestinal obstruction unspecified as to partial versus complete obstruction: Secondary | ICD-10-CM

## 2019-11-23 DIAGNOSIS — K529 Noninfective gastroenteritis and colitis, unspecified: Secondary | ICD-10-CM

## 2019-11-23 NOTE — Telephone Encounter (Signed)
[facility]  Spoke with wife as patient not available. Anal mass removed and has been in bed but doing pretty good. Blood with Bms at times.   Has been having BMs regularly and eating ok.   CT enterography- terminal ileum with possible peristalsis versus some narrowing/ hyperenhancement that can be seen in Crohn's   No overt Crohn's symptoms and patient with Colonoscopy 08/2019 that demonstrated normal terminal ileum.  Will let Dr. Gala Romney and Aliene Altes know and see their thoughts. No surgical intervention indicated but may need some lab work to see if Crohn's is in differential or another endoscopy but will leave that up to GI.  Curlene Labrum, MD [facility] [address] [place], [place] [address] [phone] (office)

## 2019-11-23 NOTE — Telephone Encounter (Signed)
Noted  

## 2019-11-27 ENCOUNTER — Telehealth (INDEPENDENT_AMBULATORY_CARE_PROVIDER_SITE_OTHER): Payer: BC Managed Care – PPO | Admitting: General Surgery

## 2019-11-27 DIAGNOSIS — K529 Noninfective gastroenteritis and colitis, unspecified: Secondary | ICD-10-CM

## 2019-11-27 NOTE — Telephone Encounter (Signed)
[facility]  Discussed case with Dr. Gala Romney. He has reviewed the CT and says that his colonoscopy was clear 08/2019.  He does not think we need to be worried about Crohn's but this is probably artifact.   Patient still with some abdominal pain and having bowel movements. He is having some blood from his rectum from the anal surgery for the condyloma.   He goes back to [facility] 11/10 for the condyloma surgery. He is sore but doing ok.   Surgical Oncology at [facility], Dr. Ebony Hail and sees Edison Pace PA.   Will try to let [facility] know about the CT enterography and tried to explain to wife who answered as patient not available that CT is not definitive for anything and that we are still trying to sort out best options as we do not want to do any exploration that is unnecessary.  He may ultimately need to see Colorectal but will reach out to [facility] so they have this information.  Curlene Labrum, MD [facility] [address] [place], [place] [address] [phone] (office)

## 2019-12-01 DIAGNOSIS — I10 Essential (primary) hypertension: Secondary | ICD-10-CM | POA: Diagnosis not present

## 2019-12-01 DIAGNOSIS — K760 Fatty (change of) liver, not elsewhere classified: Secondary | ICD-10-CM | POA: Diagnosis not present

## 2019-12-01 DIAGNOSIS — E785 Hyperlipidemia, unspecified: Secondary | ICD-10-CM | POA: Diagnosis not present

## 2019-12-01 DIAGNOSIS — D751 Secondary polycythemia: Secondary | ICD-10-CM | POA: Diagnosis not present

## 2019-12-01 DIAGNOSIS — E1129 Type 2 diabetes mellitus with other diabetic kidney complication: Secondary | ICD-10-CM | POA: Diagnosis not present

## 2019-12-05 DIAGNOSIS — R7309 Other abnormal glucose: Secondary | ICD-10-CM | POA: Diagnosis not present

## 2019-12-05 DIAGNOSIS — E785 Hyperlipidemia, unspecified: Secondary | ICD-10-CM | POA: Diagnosis not present

## 2019-12-05 DIAGNOSIS — E1122 Type 2 diabetes mellitus with diabetic chronic kidney disease: Secondary | ICD-10-CM | POA: Diagnosis not present

## 2019-12-06 DIAGNOSIS — A630 Anogenital (venereal) warts: Secondary | ICD-10-CM | POA: Diagnosis not present

## 2019-12-07 NOTE — Progress Notes (Signed)
Referring Provider: Asencion Noble, MD Primary Care Physician:  Asencion Noble, MD Primary GI Physician: Dr. Gala Romney  Chief Complaint  Patient presents with  . Dysphagia    doing fine  . anal warts    no issues    HPI:   Benjamin Moreno is a 64 y.o. male with medical history significant for diabetes, HTN, HLD, recurrent small bowel obstructions, GERD, dysphagia, chronic abdominal pain, chronic diarrhea with urgency, anal condyloma.  Prior evaluation during hospitalization for SBO in June/July with negative celiac serologies, TSH low normal, sed rate 13, ANA normal. CT A/P during hospitalization as well as prior CT in 2011 was reviewed by general surgery and radiology who states there was fecalized small bowel with no clear transition point and thickened small intestine, hypertrophic/hyperemic vasculature to the small intestine in 2011. Recommended GI evaluation for reasons for primary enteritis causing obstruction. He is presenting today s/p TCS and EGD with dilation.  Procedures 09/26/2019: EGD: Localized mild mucosal changes, single 1 cm island of salmon-colored epithelium just above GE junction, no nodularity, no ring stricture or neoplasm seen s/p dilation and biopsy (reflux changes), gastric antral erosions and nodularity s/p biopsy (reactive gastropathy, negative H. pylori), normal examined duodenum. Colonoscopy: Extensive perianal condyloma, 8 sessile polyps in the rectum and ascending colon, distal 10 cm of TI normal, stool sample submitted for C. difficile and GI pathogen panel.  Rectal polyps were hyperplastic, 2 tubular adenomas in the ascending colon and one sessile serrated polyp in the ascending colon.  Recommended repeat colonoscopy in 3 years. Stool studies were negative.   He had follow-up with Dr. Constance Haw who recommended CT enterography.  This was completed 11/22/2019 which states the terminal ileum is decompressed and mildly hyperenhancing with some areas of narrowing.  This appearance  may reflect transient peristalsis is also suggestive of minimal stricturing a can be seen in Crohn's disease.  No overt acute inflammatory findings.  Proximal small bowel nondistended without evidence of obstruction.  Dr. Gala Romney reviewed CTE.  Stated findings on CT was likely artifact as colonoscopy was clear with normal TI.  Recommended following clinically.  In regards anal condyloma, [place] surgery refer patient to [facility] oncology.  He underwent surgery 10/24/2019.  Pathology with condyloma with at least high-grade dysplasia.  He has follow-up with [facility] 12/06/2019 and was recovering well.  Plan to follow-up in 2-3 months.  Today:  Diarrhea: Continues 5-8 loose BMs daily. BMs are typically postprandial. No nocturnal BMs. Has abdominal pain prior to BMs that resolves thereafter. Hasn't been having the constant abdominal pain that he used to have. Has tried to cut back on portion sizes. Has also limited acidic foods and spicy foods and noticed significant improvement.   GERD: Well controlled on Protonix daily.   Dysphagia: Resolved.   No nausea or vomiting.   No black stools. Had some rectal bleeding after his surgery to remove anal warts in September but this has resolved.    Past Medical History:  Diagnosis Date  . Bowel obstruction ([place])   . Diabetes mellitus   . GERD (gastroesophageal reflux disease)   . HLD (hyperlipidemia)   . Hypertension     Past Surgical History:  Procedure Laterality Date  . BIOPSY  09/26/2019   Procedure: BIOPSY;  Surgeon: Daneil Dolin, MD;  [facility]: [facility];  Service: Endoscopy;;  . CATARACT EXTRACTION    . COLONOSCOPY WITH PROPOFOL N/A 09/26/2019   Procedure: COLONOSCOPY WITH PROPOFOL;  Surgeon: Daneil Dolin,  MD; extensive perianal condyloma, distal 10 cm of TI normal, multiple hyperplastic polyps, 2 tubular adenomas, and one sessile serrated polyp.  Due for repeat colonoscopy in 2024.  . ESOPHAGOGASTRODUODENOSCOPY (EGD) WITH  PROPOFOL N/A 09/26/2019   Procedure: ESOPHAGOGASTRODUODENOSCOPY (EGD) WITH PROPOFOL;  Surgeon: Daneil Dolin, MD;  Localized mild mucosal changes, single 1 cm island of salmon-colored epithelium just above GE junction, no nodularity, no ring stricture or neoplasm seen s/p dilation and biopsy (reflux changes), gastric antral erosions and nodularity s/p biopsy (reactive gastropathy, negative H. pylori), normal duodenum.   Marland Kitchen MALONEY DILATION  09/26/2019   Procedure: MALONEY DILATION;  Surgeon: Daneil Dolin, MD;  Location: [facility];  Service: Endoscopy;;  . POLYPECTOMY  09/26/2019   Procedure: POLYPECTOMY;  Surgeon: Daneil Dolin, MD;  [facility];  Service: Endoscopy;;    Current Outpatient Medications  Medication Sig Dispense Refill  . amLODipine (NORVASC) 10 MG tablet Take 1 tablet (10 mg total) by mouth daily. 30 tablet 5  . atorvastatin (LIPITOR) 20 MG tablet Take 20 mg by mouth daily.    . empagliflozin (JARDIANCE) 25 MG TABS tablet Take 25 mg by mouth daily.    Marland Kitchen glimepiride (AMARYL) 2 MG tablet Take 2 mg by mouth every morning.    . imiquimod (ALDARA) 5 % cream Apply 1 application topically at bedtime.    Marland Kitchen LANTUS SOLOSTAR 100 UNIT/ML Solostar Pen Inject 50 Units into the skin every morning.     . metFORMIN (GLUCOPHAGE) 500 MG tablet Take 500 mg by mouth 2 (two) times daily.    . metoprolol succinate (TOPROL-XL) 100 MG 24 hr tablet Take 1 tablet (100 mg total) by mouth daily. Take with or immediately following a meal. 30 tablet 3  . ondansetron (ZOFRAN ODT) 4 MG disintegrating tablet Take 1 tablet (4 mg total) by mouth every 8 (eight) hours as needed for nausea or vomiting. 10 tablet 0  . pantoprazole (PROTONIX) 40 MG tablet Take 1 tablet (40 mg total) by mouth daily. 30 tablet 2  . ramipril (ALTACE) 10 MG capsule Take 1 capsule (10 mg total) by mouth daily. 30 capsule 3  . dicyclomine (BENTYL) 10 MG capsule Take 1 capsule (10 mg total) by mouth 4 (four) times daily -   before meals and at bedtime. 120 capsule 2   No current facility-administered medications for this visit.    Allergies as of 12/08/2019  . (No Known Allergies)    Family History  Problem Relation Age of Onset  . Cancer Mother   . Stomach cancer Mother   . Heart disease Father   . Bowel Disease Paternal Grandmother        Patient isn't sure the etiology. Required colostomy.   . Stomach cancer Maternal Uncle        several uncles with stomach cancer  . Colon cancer Neg Hx   . Inflammatory bowel disease Neg Hx     Social History   Socioeconomic History  . Marital status: Married    Spouse name: Not on file  . Number of children: Not on file  . Years of education: Not on file  . Highest education level: Not on file  Occupational History  . Not on file  Tobacco Use  . Smoking status: Never Smoker  . Smokeless tobacco: Never Used  Vaping Use  . Vaping Use: Never used  Substance and Sexual Activity  . Alcohol use: No  . Drug use: No  . Sexual activity: Yes  Other Topics Concern  . Not on file  Social History Narrative  . Not on file   Social Determinants of Health   Financial Resource Strain:   . Difficulty of Paying Living Expenses: Not on file  Food Insecurity:   . Worried About Charity fundraiser in the Last Year: Not on file  . Ran Out of Food in the Last Year: Not on file  Transportation Needs:   . Lack of Transportation (Medical): Not on file  . Lack of Transportation (Non-Medical): Not on file  Physical Activity:   . Days of Exercise per Week: Not on file  . Minutes of Exercise per Session: Not on file  Stress:   . Feeling of Stress : Not on file  Social Connections:   . Frequency of Communication with Friends and Family: Not on file  . Frequency of Social Gatherings with Friends and Family: Not on file  . Attends Religious Services: Not on file  . Active Member of Clubs or Organizations: Not on file  . Attends Archivist Meetings: Not on  file  . Marital Status: Not on file    Review of Systems: Gen: Denies fever, chills, cold or flulike symptoms and lightheadedness, dizziness, presyncope, syncope. CV: Denies chest pain or palpitations. Resp: Denies dyspnea or cough. GI: See HPI Heme: See HPI  Physical Exam: BP (!) 166/90   Pulse 98   Temp 97.8 F (36.6 C)   Ht 5\' 6"  (1.676 m)   Wt 236 lb 12.8 oz (107.4 kg)   BMI 38.22 kg/m  General:   Alert and oriented. No distress noted. Pleasant and cooperative.  Head:  Normocephalic and atraumatic. Eyes:  Conjuctiva clear without scleral icterus. Heart:  S1, S2 present without murmurs appreciated. Lungs:  Clear to auscultation bilaterally. No wheezes, rales, or rhonchi. No distress.  Abdomen:  +BS, soft, non-tender and non-distended. No rebound or guarding. No HSM or masses noted. Msk:  Symmetrical without gross deformities. Normal posture. Extremities:  Without edema. Neurologic:  Alert and  oriented x4 Psych:  Normal mood and affect.

## 2019-12-08 ENCOUNTER — Other Ambulatory Visit: Payer: Self-pay

## 2019-12-08 ENCOUNTER — Ambulatory Visit: Payer: BC Managed Care – PPO | Admitting: Gastroenterology

## 2019-12-08 ENCOUNTER — Encounter: Payer: Self-pay | Admitting: Gastroenterology

## 2019-12-08 VITALS — BP 166/90 | HR 98 | Temp 97.8°F | Ht 66.0 in | Wt 236.8 lb

## 2019-12-08 DIAGNOSIS — R1084 Generalized abdominal pain: Secondary | ICD-10-CM | POA: Diagnosis not present

## 2019-12-08 DIAGNOSIS — K529 Noninfective gastroenteritis and colitis, unspecified: Secondary | ICD-10-CM

## 2019-12-08 DIAGNOSIS — K219 Gastro-esophageal reflux disease without esophagitis: Secondary | ICD-10-CM | POA: Diagnosis not present

## 2019-12-08 DIAGNOSIS — R1310 Dysphagia, unspecified: Secondary | ICD-10-CM | POA: Diagnosis not present

## 2019-12-08 MED ORDER — DICYCLOMINE HCL 10 MG PO CAPS: 10.0000 mg | ORAL_CAPSULE | Freq: Three times a day (TID) | ORAL | 2 refills | Status: DC

## 2019-12-08 NOTE — Patient Instructions (Signed)
I like for you to try Bentyl (dicyclomine) 10 mg up to 3 times daily before meals and at bedtime for diarrhea and abdominal cramping.  -You may start with taking Bentyl once in the morning before breakfast and increase as needed. -Side effects to monitor for include dizziness, difficulty urinating, dry mouth, dry eyes.  Let us know if this occurs.  Continue taking Protonix 40 mg daily 30 minutes before breakfast.  Continue avoiding reflux triggers including fried, fatty, greasy, spicy, citrus foods. Avoid caffeine and carbonated beverages. Avoid chocolate. Do not eat within 3 hours of laying down.  I am glad you are feeling better!  We will plan to follow-up with you in 3 months.  Do not hesitate to call if you have questions or concerns prior.  Aliene Altes, PA-C [facility]

## 2019-12-08 NOTE — Assessment & Plan Note (Addendum)
Chronic history of loose to watery BMs, typically postprandial with associated abdominal pain prior to BMs that improves thereafter.  Currently with 5-8 BMs daily.  Prior evaluation has included negative celiac serologies, TSH normal, ANA normal, sed rate normal, C. difficile and GI pathogen panel negative.  Colonoscopy in August 2021 with normal TI, hyperplastic polyps, 2 tubular adenomas, and one sessile serrated polyp with recommendations to repeat in 3 years.  Notably, he also has history of chronic generalized abdominal pain that was worsened by meals as well as recurrent small bowel obstruction. There was some concern for IBD; however, he has also had EGD and CTE. ED with reflux esophagitis, gastritis, normal examined duodenum.  CT enterography stating TI is decompressed and mildly hyperenhancing with some areas of narrowing.  Dr. Gala Romney reviewed this and stated this was likely artifact and recommend following clinically.  Patient has made dietary adjustments including avoiding spicy and citrus foods and limiting portion sizes and has essentially had resolution of generalized abdominal pain.  Only with abdominal pain prior to BMs that resolved thereafter.  Abdominal exam is completely benign today.  Suspect diarrhea is likely secondary to IBS-D.  Doubt IBD at this point.  Plan: Trial Bentyl 10 mg up to 3 times daily before meals and at bedtime.  Counseled on side effects to monitor for including dizziness, dry mouth, dry eyes, and difficulty urinating. Follow-up in 3 months.

## 2019-12-08 NOTE — Assessment & Plan Note (Signed)
Well-controlled on Protonix 40 mg daily.  Notably, patient has also made dietary changes including limiting portion sizes and avoiding spicy and citrus foods which is also helped.  Plan: Continue Protonix 40 mg daily Continue avoiding reflux triggers including fried, fatty, greasy, spicy, citrus foods. Avoid caffeine and carbonated beverages. Avoid chocolate. Do not eat within 3 hours of laying down.

## 2019-12-08 NOTE — Assessment & Plan Note (Addendum)
Resolved s/p EGD 09/26/2019 revealing localized mild mucosal changes, single 1 cm island of salmon-colored epithelium just above GE junction, no nodularity, no ring stricture or neoplasm seen s/p dilation and biopsy (reflux changes). He has continued on PPI daily and has made dietary changes as well including decreasing portion size and avoiding citrus and spicy foods; GERD is well controlled.   He will continue Protonix daily and monitor for return of symptoms.

## 2019-12-08 NOTE — Assessment & Plan Note (Signed)
Addressed under chronic diarrhea

## 2020-02-05 DIAGNOSIS — A630 Anogenital (venereal) warts: Secondary | ICD-10-CM | POA: Diagnosis not present

## 2020-02-05 DIAGNOSIS — D013 Carcinoma in situ of anus and anal canal: Secondary | ICD-10-CM | POA: Diagnosis not present

## 2020-03-15 ENCOUNTER — Other Ambulatory Visit: Payer: Self-pay

## 2020-03-15 ENCOUNTER — Ambulatory Visit (INDEPENDENT_AMBULATORY_CARE_PROVIDER_SITE_OTHER): Payer: BC Managed Care – PPO | Admitting: Gastroenterology

## 2020-03-15 ENCOUNTER — Encounter: Payer: Self-pay | Admitting: Gastroenterology

## 2020-03-15 VITALS — BP 158/92 | HR 78 | Temp 96.9°F | Ht 66.0 in | Wt 244.2 lb

## 2020-03-15 DIAGNOSIS — K529 Noninfective gastroenteritis and colitis, unspecified: Secondary | ICD-10-CM

## 2020-03-15 DIAGNOSIS — R1084 Generalized abdominal pain: Secondary | ICD-10-CM | POA: Diagnosis not present

## 2020-03-15 DIAGNOSIS — K219 Gastro-esophageal reflux disease without esophagitis: Secondary | ICD-10-CM | POA: Diagnosis not present

## 2020-03-15 MED ORDER — RIFAXIMIN 550 MG PO TABS: 550.0000 mg | ORAL_TABLET | Freq: Three times a day (TID) | ORAL | 0 refills | Status: AC

## 2020-03-15 NOTE — Patient Instructions (Addendum)
1. For GERD: Continue pantoprazole 40 mg daily before breakfast. 2. For abdominal pain and diarrhea: Xifaxan 550 mg 3 times daily for 14 days.  I have sent prescription to [facility], it appears that it is approved by your insurance.  However, if medication is expensive, please do not purchase and let us know.

## 2020-03-15 NOTE — Progress Notes (Signed)
Primary Care Physician: Asencion Noble, MD  Primary Gastroenterologist:  Garfield Cornea, MD   Chief Complaint  Patient presents with  . Gastroesophageal Reflux    Doing okay  . Diarrhea    Few times  month.    HPI: Benjamin Moreno is a 65 y.o. male here with history of recurrent small bowel obstructions/pseudoobstructions, GERD, chronic postprandial abdominal pain/bloating, diarrhea, anal condyloma presenting for follow-up.  Patient last seen in November.  Last hospitalization for small bowel obstruction in June/July 2021, treated conservatively.  Work-up has been negative for celiac serologies, TSH, sed rate 13.  Completed EGD and colonoscopy September 26, 2019: EGD: Localized mild mucosal changes of the esophagus, single 1 cm island of salmon-colored epithelium just above the GE junction with no ring/stricture.  Status post dilation for history of dysphagia.  Esophageal biopsies consistent with reflux changes.  Gastric antral erosions and nodularity with reactive gastropathy, negative H. pylori on biopsy.  Normal duodenum. Colonoscopy: Extensive perianal condyloma, 8 sessile polyps in the rectum and ascending colon, distal 10 cm of TI normal, stool submitted for C. difficile and GI pathogen panel both negative.  Rectal polyps were hyperplastic, 2 tubular adenomas of the ascending colon and one sessile serrated polyp in the ascending colon.  Repeat colonoscopy in 3 years.  Saw Dr. Constance Haw and completed CT enterography.  Terminal ileum decompressed and mildly hyperenhancing with some areas of narrowing.  Appearance may reflect transient peristalsis but is also suggestive of minimal stricturing and can be seen in Crohn's.  No overt acute inflammatory findings.  Proximal small bowel nondistended without evidence of obstruction.  CTE reviewed with Dr. Gala Romney, felt like findings were artifactual given recent normal terminal ileum on colonoscopy.  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  He completed surgery in September 2021.  Follows with [facility] oncology every 3 months.  Today: Overall patient feels somewhat improved.  Diarrhea seems to be slightly improved.  Most days he has 3-4 stools a day, can be [facility] 4-7.  Few times a month he has "diarrhea".  Stools associated with urgency.  Typically postprandial.  Some days are much better than others.  Previously tried Bentyl but it made him too thirsty even with only 1 pill/day.  Continues to have postprandial abdominal crampy pain associated with bloating off and on.  When it happens he tends to cut back on oral intake until it resolves.  Usually last for several hours.  If he can pass gas or have stool he typically feels better. Weight up 8 pounds in the past 3 months.  No melena or rectal bleeding.  Dysphagia resolved.  Heartburn well controlled.    Current Outpatient Medications  Medication Sig Dispense Refill  . amLODipine (NORVASC) 10 MG tablet Take 1 tablet (10 mg total) by mouth daily. 30 tablet 5  . atorvastatin (LIPITOR) 20 MG tablet Take 20 mg by mouth daily.    . empagliflozin (JARDIANCE) 25 MG TABS tablet Take 25 mg by mouth daily.    Marland Kitchen glimepiride (AMARYL) 2 MG tablet Take 2 mg by mouth every morning.    . imiquimod (ALDARA) 5 % cream Apply 1 application topically at bedtime.    Marland Kitchen LANTUS SOLOSTAR 100 UNIT/ML Solostar Pen Inject 50 Units into the skin every morning.     . metFORMIN (GLUCOPHAGE) 500 MG tablet Take 500 mg by mouth 2 (two) times daily.    . metoprolol succinate (  TOPROL-XL) 100 MG 24 hr tablet Take 1 tablet (100 mg total) by mouth daily. Take with or immediately following a meal. 30 tablet 3  . ondansetron (ZOFRAN ODT) 4 MG disintegrating tablet Take 1 tablet (4 mg total) by mouth every 8 (eight) hours as needed for nausea or vomiting. 10 tablet 0  . pantoprazole (PROTONIX) 40 MG tablet Take 1 tablet (40 mg total) by mouth daily. 30 tablet 2  .  ramipril (ALTACE) 10 MG capsule Take 1 capsule (10 mg total) by mouth daily. 30 capsule 3   No current facility-administered medications for this visit.    Allergies as of 03/15/2020  . (No Known Allergies)    ROS:  General: Negative for anorexia, weight loss, fever, chills, fatigue, weakness. ENT: Negative for hoarseness, difficulty swallowing , nasal congestion. CV: Negative for chest pain, angina, palpitations, dyspnea on exertion, peripheral edema.  Respiratory: Negative for dyspnea at rest, dyspnea on exertion, cough, sputum, wheezing.  GI: See history of present illness. GU:  Negative for dysuria, hematuria, urinary incontinence, urinary frequency, nocturnal urination.  Endo: Negative for unusual weight change.    Physical Examination:   BP (!) 158/92   Pulse 78   Temp (!) 96.9 F (36.1 C)   Ht 5\' 6"  (1.676 m)   Wt 244 lb 3.2 oz (110.8 kg)   BMI 39.41 kg/m   General: Well-nourished, well-developed in no acute distress.  Eyes: No icterus. Mouth: masked Lungs: Clear to auscultation bilaterally.  Heart: Regular rate and rhythm, no murmurs rubs or gallops.  Abdomen: Bowel sounds are normal, nontender, nondistended, no hepatosplenomegaly or masses, no abdominal bruits or hernia , no rebound or guarding.   Extremities: No lower extremity edema. No clubbing or deformities. Neuro: Alert and oriented x 4   Skin: Warm and dry, no jaundice.   Psych: Alert and cooperative, normal mood and affect.  Labs:  Lab Results  Component Value Date   CREATININE 1.00 11/22/2019   BUN 7 (L) 07/26/2019   NA 139 07/26/2019   K 3.8 07/26/2019   CL 102 07/26/2019   CO2 26 07/26/2019   Lab Results  Component Value Date   ALT 38 07/25/2019   AST 48 (H) 07/25/2019   ALKPHOS 61 07/25/2019   BILITOT 0.8 07/25/2019   Lab Results  Component Value Date   WBC 6.0 07/26/2019   HGB 16.8 07/26/2019   HCT 48.8 07/26/2019   MCV 92.2 07/26/2019   PLT 179 07/26/2019   Lab Results   Component Value Date   LIPASE 33 07/24/2019   Lab Results  Component Value Date   TSH 0.579 07/25/2019   Lab Results  Component Value Date   HGBA1C 9.0 (H) 07/24/2019    Imaging Studies: No results found.    Assessment:  Pleasant 65 year old male with history of recurrent small bowel obstruction/pseudoobstruction, postprandial abdominal pain/bloating, diarrhea, GERD presenting for follow-up.  Chronic postprandial abdominal pain/bloating: Last hospitalization for small bowel obstruction June 2021.  Extensive evaluation as outlined above.  Most recent imaging CTE in October with decompressed and mildly hyperenhancing with some areas of narrowing at the TI, possibly transient peristalsis.  Felt to be artifactual given recent colonoscopy with terminal ileoscopy which was normal.  Clinically patient is improved but he continues to have intermittent postprandial abdominal pain which is diffuse in nature, crampy in quality and associated with bloating.  Typically improved with passing gas or stool.  Patient will stop eating until symptoms pass usually hours later.  No prior abdominal  surgeries.  Symptoms not likely biliary.  Cannot exclude underlying IBS-D or small bowel bacterial overgrowth contributing to his symptoms.  Suspect element of ongoing intermittent pseudoobstruction.  GERD: Well-controlled on pantoprazole 40 mg daily.  Dysphagia resolved status post esophageal dilation.  Chronic diarrhea: Somewhat improved, less frequent.  Suspect multifactorial with some IBS-D, medication effect (Metformin), possible diabetic nephropathy.  Plan:  1. Continue pantoprazole 40 mg daily before breakfast. 2. Trial of Xifaxan 550 mg 3 times daily for 14 days. 3. Of note, Dr. Constance Haw mentioned consideration of small bowel capsule endoscopy.  Distal terminal ileum has been evaluated via colonoscopy.  Not clear it would add any benefit at this time but would consider if ongoing symptoms. 4. We will  have patient return in 4 months for evaluation with Dr. Gala Romney.  He will call sooner if any worsening symptoms.

## 2020-03-18 NOTE — Progress Notes (Signed)
Cc'ed to pcp °

## 2020-05-06 DIAGNOSIS — D013 Carcinoma in situ of anus and anal canal: Secondary | ICD-10-CM | POA: Diagnosis not present

## 2020-07-15 ENCOUNTER — Encounter: Payer: Self-pay | Admitting: Internal Medicine

## 2020-11-06 DIAGNOSIS — D013 Carcinoma in situ of anus and anal canal: Secondary | ICD-10-CM | POA: Diagnosis not present

## 2021-01-03 IMAGING — DX DG CHEST 1V PORT
1 series · 1 of 1 positions shown · non-contrast
Comparison: None.

CLINICAL DATA: NG tube placement

EXAM:
PORTABLE CHEST 1 VIEW

[chest ap]
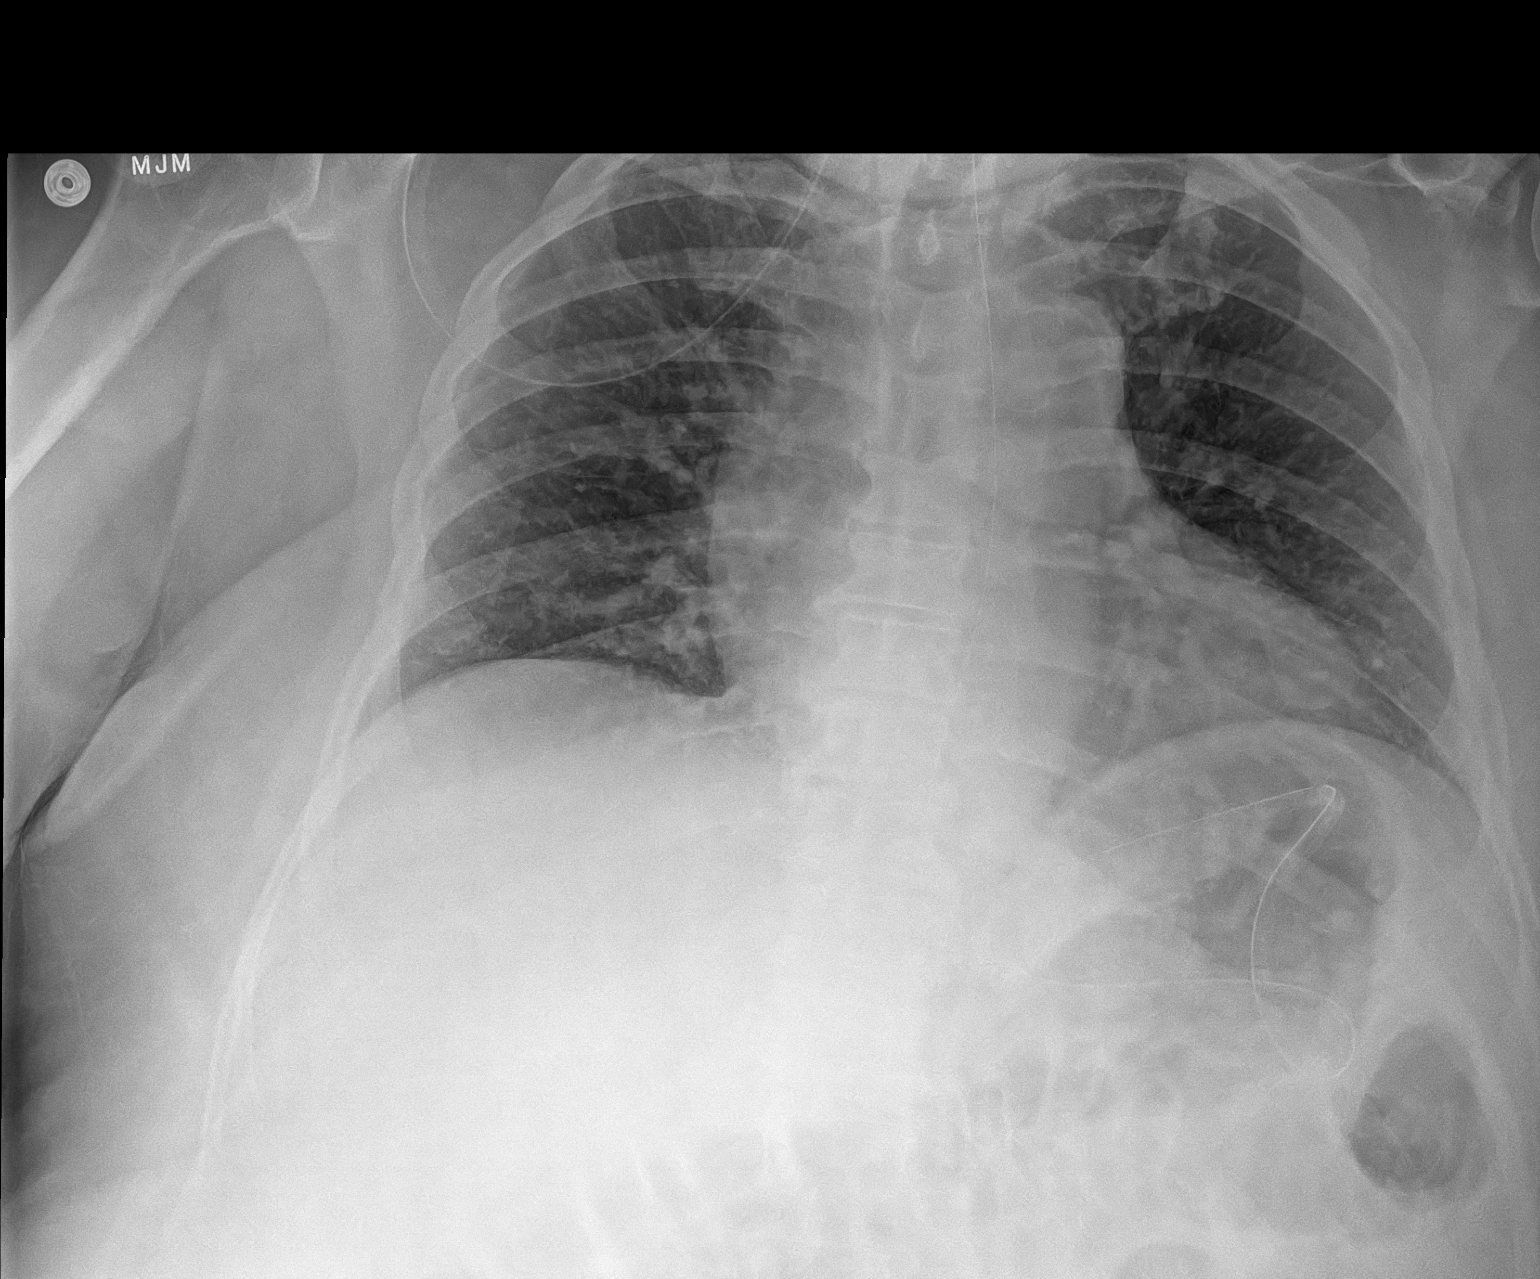

[1 of 1 positions shown; findings below may reference images not displayed]

FINDINGS: NG tube coils in the fundus of the stomach. Lungs are clear. Heart
is normal size. No effusions.
IMPRESSION: NG tube coils in the fundus of the stomach.

## 2021-05-04 IMAGING — CT CT ENTEROGRAPHY (ABD-PELV W/ CM)
2 of 6 series · 16 of 46 positions shown, 18 images · IV contrast (omnipaque)
Comparison: 07/24/2019, 06/06/2011

CLINICAL DATA: Bowel obstruction suspected, gastritis or colitis,
thickened small bowel prior CT, pseudo-obstruction

EXAM:
CT ABDOMEN AND PELVIS WITH CONTRAST (ENTEROGRAPHY)
TECHNIQUE: Multidetector CT of the abdomen and pelvis during bolus
administration of intravenous contrast. Negative oral contrast was
given.
CONTRAST:  125mL OMNIPAQUE IOHEXOL 300 MG/ML SOLN, negative oral
enteric contrast

[Series 3: entero thins · axial · 0.80mm/px · z∈[+924,+1354]mm · 13 of 247 slices shown, 15 images]
[im 16/247  soft-tissue]
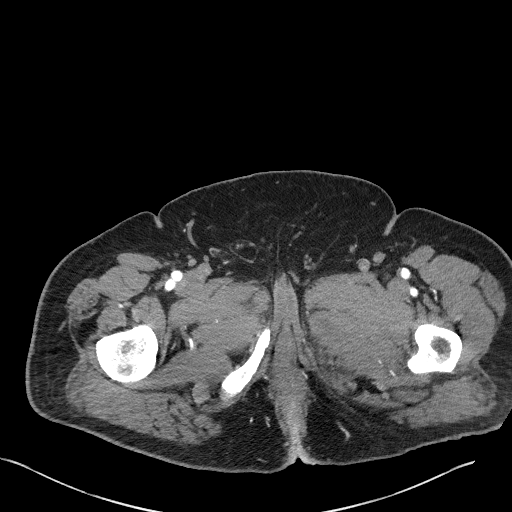
[im 16/247  bone]
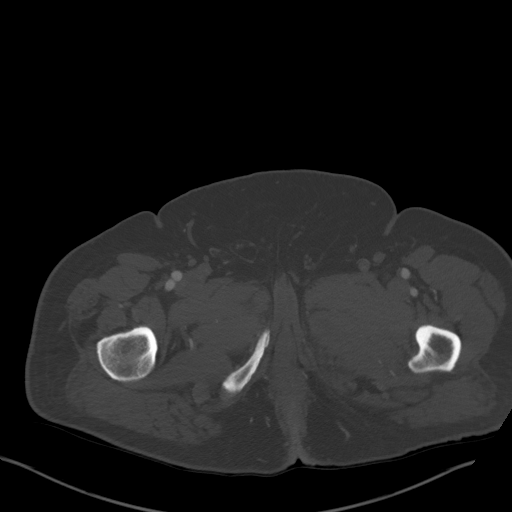
[im 31/247  soft-tissue]
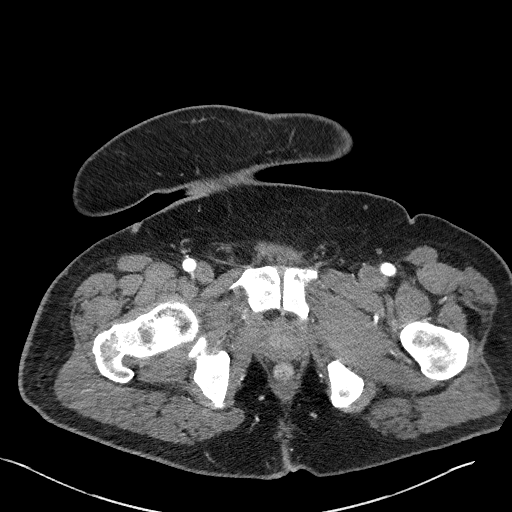
[im 47/247  soft-tissue]
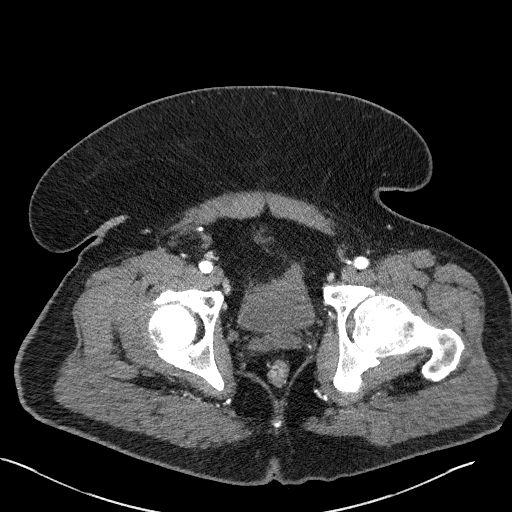
[im 77/247  soft-tissue]
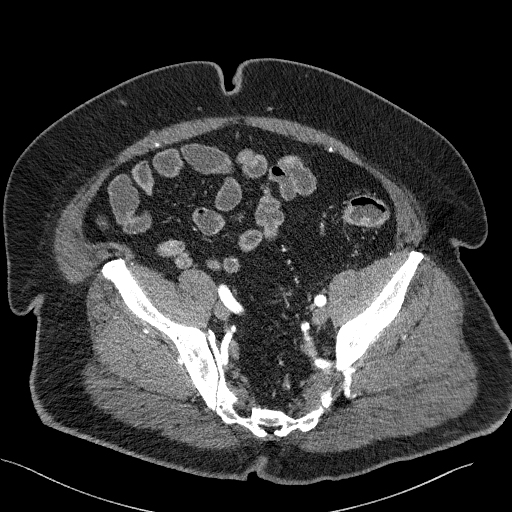
[im 93/247  soft-tissue]
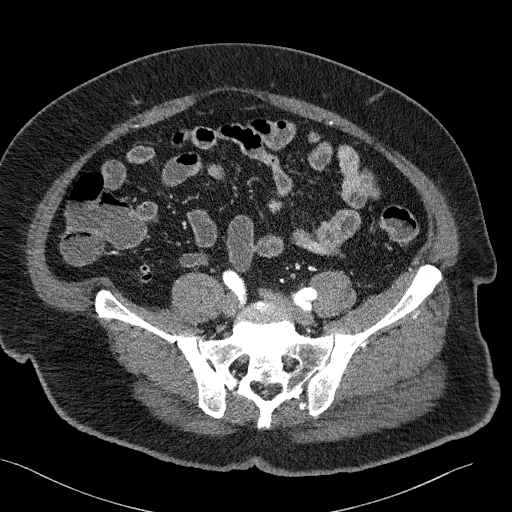
[im 108/247  soft-tissue]
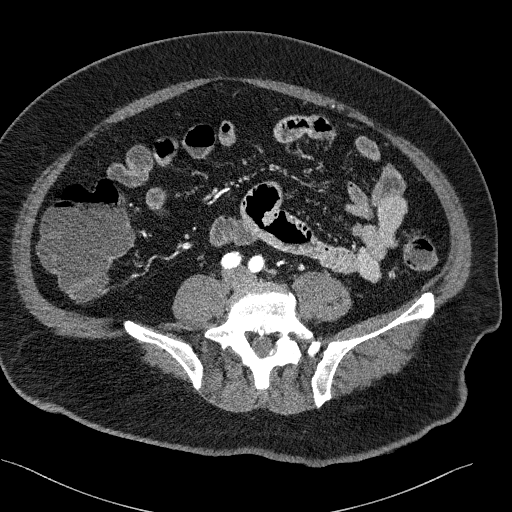
[im 124/247  soft-tissue]
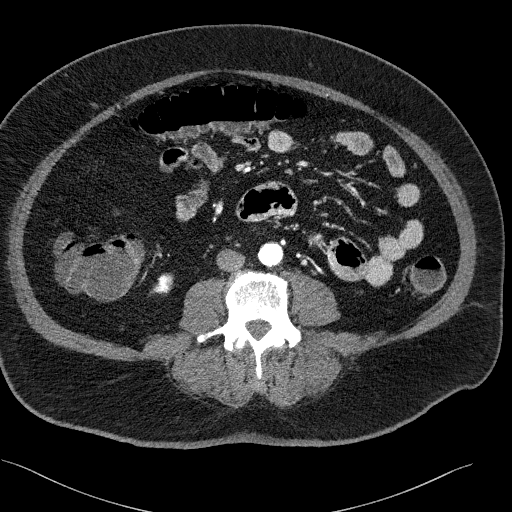
[im 139/247  soft-tissue]
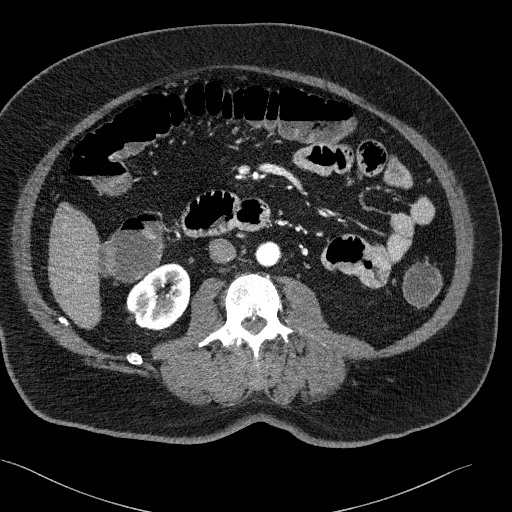
[im 154/247  soft-tissue]
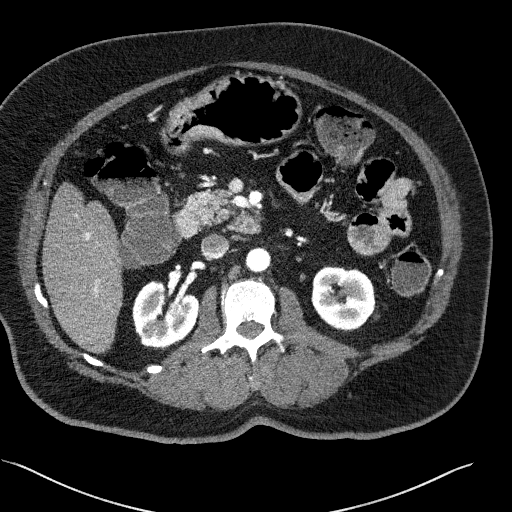
[im 154/247  bone]
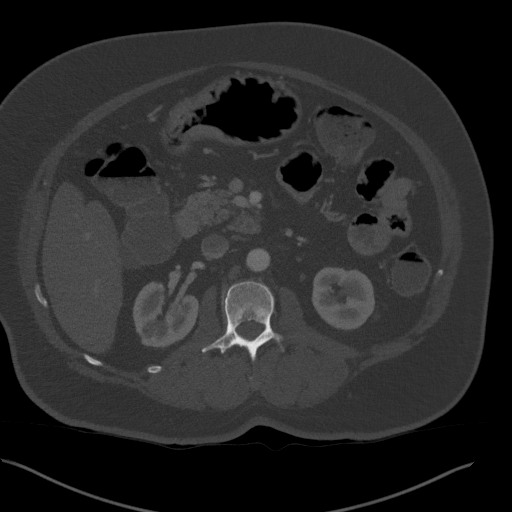
[im 170/247  soft-tissue]
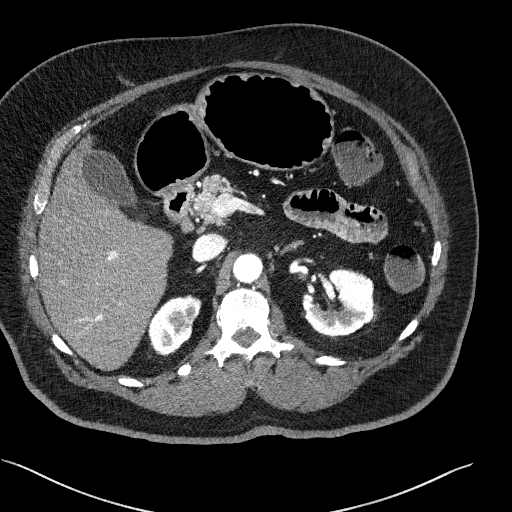
[im 200/247  soft-tissue]
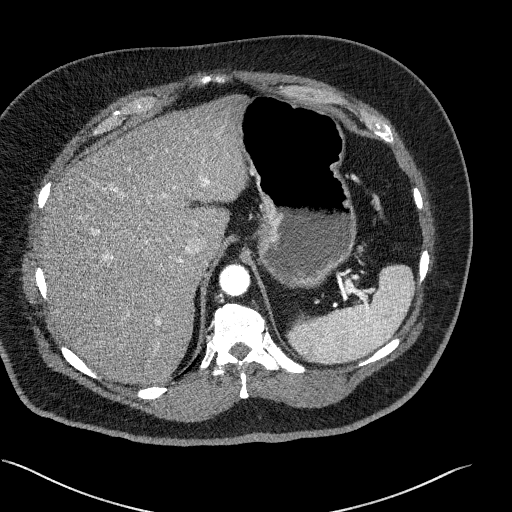
[im 216/247  soft-tissue]
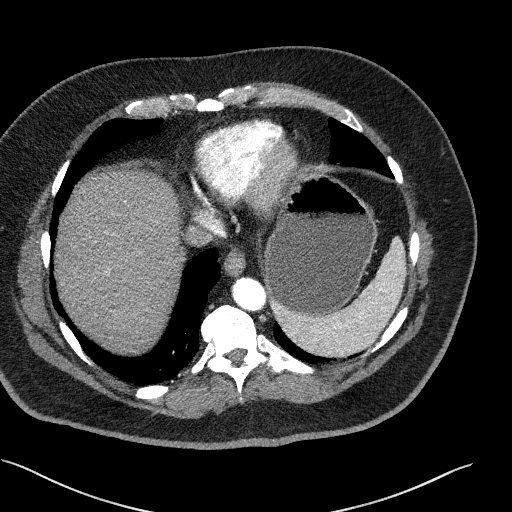
[im 231/247  soft-tissue]
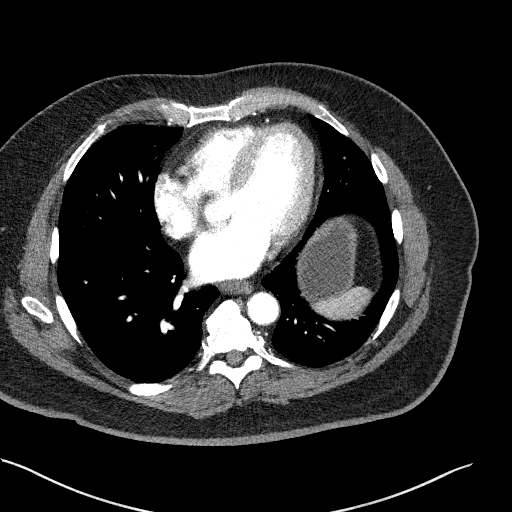

[Series 6: coronal · coronal · 0.85mm/px · 3 of 114 slices shown]
[im 38/114  soft-tissue]
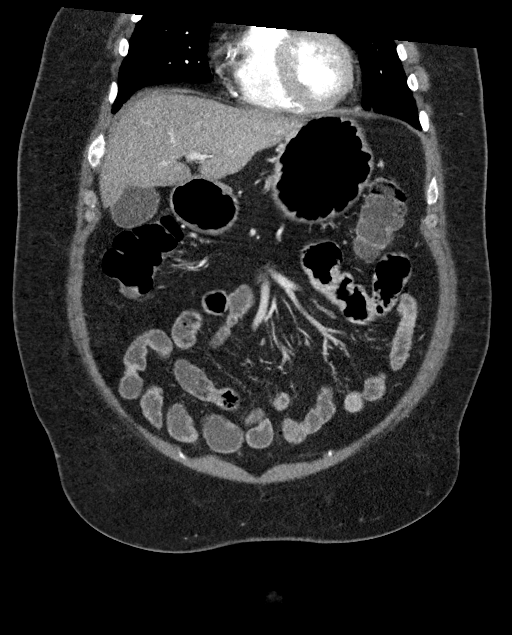
[im 51/114  soft-tissue]
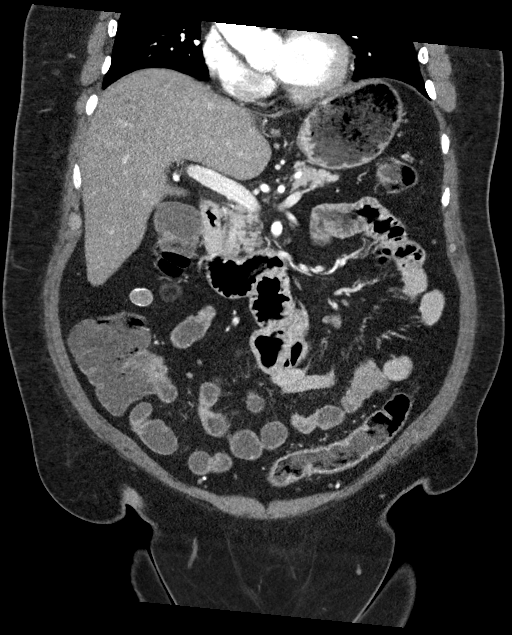
[im 63/114  soft-tissue]
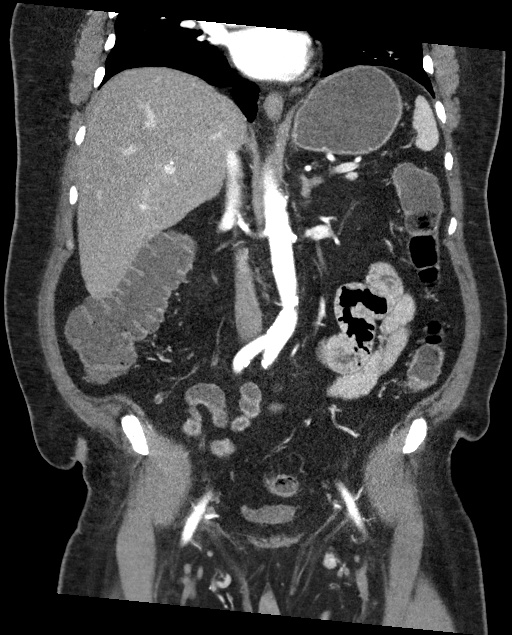

[16 of 46 positions shown; findings below may reference images not displayed]

FINDINGS: Lower chest: No acute abnormality.

Hepatobiliary: No solid liver abnormality is seen. No gallstones,
gallbladder wall thickening, or biliary dilatation.

Pancreas: Unremarkable. No pancreatic ductal dilatation or
surrounding inflammatory changes.

Spleen: Normal in size without significant abnormality.

Adrenals/Urinary Tract: Adrenal glands are unremarkable. Kidneys are
normal, without renal calculi, solid lesion, or hydronephrosis.
Bladder is unremarkable.

Stomach/Bowel: Stomach is within normal limits. The terminal ileum
is decompressed and mildly hyperenhancing with some areas of
narrowing (e.g. Series 6, image 51, 57, 60). The proximal small
bowel is nondistended. Appendix appears normal. Sigmoid
diverticulosis.

Vascular/Lymphatic: Aortic atherosclerosis. No enlarged abdominal or
pelvic lymph nodes.

Reproductive: No mass or other significant abnormality.

Other: No abdominal wall hernia or abnormality. No abdominopelvic
ascites. Unchanged calcified nodule adjacent to the cecum measuring
2.5 x 2.0 cm, consistent with a calcified epiploic appendage (series
2, image 48).

Musculoskeletal: No acute or significant osseous findings.
IMPRESSION: 1. The terminal ileum is decompressed and mildly hyperenhancing with
some areas of narrowing. This appearance may reflect transient
peristalsis but is also suggestive of minimal stricturing and can be
seen in Crohn's disease. Consider endoscopic evaluation if there is
clinical suspicion for inflammatory bowel disease. There are no
overt acute inflammatory findings.
2. The proximal small bowel is nondistended without evidence of
obstruction at this time.
3. Sigmoid diverticulosis without evidence of acute diverticulitis.
4. Aortic Atherosclerosis ([id number].M).

## 2021-05-07 DIAGNOSIS — D013 Carcinoma in situ of anus and anal canal: Secondary | ICD-10-CM | POA: Diagnosis not present

## 2021-11-03 DIAGNOSIS — D013 Carcinoma in situ of anus and anal canal: Secondary | ICD-10-CM | POA: Diagnosis not present

## 2021-11-03 DIAGNOSIS — Z23 Encounter for immunization: Secondary | ICD-10-CM | POA: Diagnosis not present

## 2021-11-03 DIAGNOSIS — A630 Anogenital (venereal) warts: Secondary | ICD-10-CM | POA: Diagnosis not present

## 2021-11-03 DIAGNOSIS — R896 Abnormal cytological findings in specimens from other organs, systems and tissues: Secondary | ICD-10-CM | POA: Diagnosis not present

## 2021-11-03 DIAGNOSIS — R8569 Abnormal cytological findings in specimens from other digestive organs and abdominal cavity: Secondary | ICD-10-CM | POA: Diagnosis not present

## 2021-12-10 DIAGNOSIS — Z125 Encounter for screening for malignant neoplasm of prostate: Secondary | ICD-10-CM | POA: Diagnosis not present

## 2021-12-10 DIAGNOSIS — E1129 Type 2 diabetes mellitus with other diabetic kidney complication: Secondary | ICD-10-CM | POA: Diagnosis not present

## 2021-12-10 DIAGNOSIS — I10 Essential (primary) hypertension: Secondary | ICD-10-CM | POA: Diagnosis not present

## 2021-12-10 DIAGNOSIS — Z79899 Other long term (current) drug therapy: Secondary | ICD-10-CM | POA: Diagnosis not present

## 2021-12-10 DIAGNOSIS — E785 Hyperlipidemia, unspecified: Secondary | ICD-10-CM | POA: Diagnosis not present

## 2021-12-17 DIAGNOSIS — Z23 Encounter for immunization: Secondary | ICD-10-CM | POA: Diagnosis not present

## 2021-12-17 DIAGNOSIS — I10 Essential (primary) hypertension: Secondary | ICD-10-CM | POA: Diagnosis not present

## 2021-12-17 DIAGNOSIS — E1122 Type 2 diabetes mellitus with diabetic chronic kidney disease: Secondary | ICD-10-CM | POA: Diagnosis not present

## 2021-12-17 DIAGNOSIS — R7309 Other abnormal glucose: Secondary | ICD-10-CM | POA: Diagnosis not present

## 2021-12-17 DIAGNOSIS — E785 Hyperlipidemia, unspecified: Secondary | ICD-10-CM | POA: Diagnosis not present

## 2022-09-07 ENCOUNTER — Encounter: Payer: Self-pay | Admitting: *Deleted

## 2023-03-11 ENCOUNTER — Encounter (INDEPENDENT_AMBULATORY_CARE_PROVIDER_SITE_OTHER): Payer: Self-pay | Admitting: *Deleted

## 2023-05-24 ENCOUNTER — Telehealth: Payer: Self-pay | Admitting: *Deleted

## 2023-05-24 NOTE — Telephone Encounter (Signed)
  Procedure: COLONOSCOPY  Height: 5'5 Weight: 219LBS        Have you had a colonoscopy before?  08/2019, Dr. Riley Cheadle  Do you have family history of colon cancer?  no  Do you have a family history of polyps? yes  Previous colonoscopy with polyps removed? yes  Do you have a history colorectal cancer?   no  Are you diabetic? yes  Do you have a prosthetic or mechanical heart valve? no  Do you have a pacemaker/defibrillator?   no  Have you had endocarditis/atrial fibrillation?  no  Do you use supplemental oxygen/CPAP?  no  Have you had joint replacement within the last 12 months?  no  Do you tend to be constipated or have to use laxatives?  no   Do you have history of alcohol use? If yes, how much and how often.  no  Do you have history or are you using drugs? If yes, what do are you  using?  no  Have you ever had a stroke/heart attack?  no  Have you ever had a heart or other vascular stent placed,?no  Do you take weight loss medication? yes  Do you take any blood-thinning medications such as: (Plavix, aspirin, Coumadin, Aggrenox, Brilinta, Xarelto, Eliquis, Pradaxa, Savaysa or Effient)? no  If yes we need the name, milligram, dosage and who is prescribing doctor:               Current Outpatient Medications  Medication Sig Dispense Refill   amLODipine  (NORVASC ) 10 MG tablet Take 1 tablet (10 mg total) by mouth daily. 30 tablet 5   glimepiride (AMARYL) 2 MG tablet Take 2 mg by mouth every morning.     LANTUS SOLOSTAR 100 UNIT/ML Solostar Pen Inject into the skin. 60 units at bedtime     metFORMIN (GLUCOPHAGE) 500 MG tablet Take 500 mg by mouth daily with breakfast.     ramipril  (ALTACE ) 10 MG capsule Take 1 capsule (10 mg total) by mouth daily. 30 capsule 3   tirzepatide (MOUNJARO) 10 MG/0.5ML Pen Inject 10 mg into the skin once a week.     No current facility-administered medications for this visit.    No Known Allergies

## 2023-06-03 NOTE — Telephone Encounter (Signed)
 OK to schedule. ASA 2  Hold Mounjaro x 1 week prior.  1 day prior: 1/2 dose of lantus at bedtime. Take other DM meds as prescribed.  Day of procedure: No morning diabetes medications

## 2023-06-04 ENCOUNTER — Other Ambulatory Visit: Payer: Self-pay | Admitting: *Deleted

## 2023-06-04 ENCOUNTER — Encounter: Payer: Self-pay | Admitting: *Deleted

## 2023-06-04 MED ORDER — PEG 3350-KCL-NA BICARB-NACL 420 G PO SOLR: 4000.0000 mL | Freq: Once | ORAL | 0 refills | Status: AC

## 2023-06-04 NOTE — Telephone Encounter (Signed)
 Pt's spouse will call back to schedule once she gets to work

## 2023-06-04 NOTE — Telephone Encounter (Signed)
 Pt has been scheduled for 07/08/23 with Dr.Rourk.  Instructions mailed and prep sent to the pharmacy

## 2023-06-07 ENCOUNTER — Encounter (INDEPENDENT_AMBULATORY_CARE_PROVIDER_SITE_OTHER): Payer: Self-pay | Admitting: *Deleted

## 2023-06-07 NOTE — Telephone Encounter (Signed)
 Referral completed, [facility] apt letter sent to PCP

## 2023-07-07 NOTE — Anesthesia Preprocedure Evaluation (Addendum)
 Anesthesia Evaluation  Patient identified by MRN, date of birth, ID band Patient awake    Reviewed: Allergy & Precautions, H&P , NPO status , Patient's Chart, lab work & pertinent test results, reviewed documented beta blocker date and time   History of Anesthesia Complications Negative for: history of anesthetic complications  Airway Mallampati: II  TM Distance: >3 FB Neck ROM: Full    Dental no notable dental hx. (+) Edentulous Upper, Edentulous Lower   Pulmonary neg pulmonary ROS, pneumonia, resolved   Pulmonary exam normal breath sounds clear to auscultation       Cardiovascular Exercise Tolerance: Good METS: 3 - Mets hypertension, Pt. on medications Normal cardiovascular exam Rhythm:Regular Rate:Normal  25-Sep-2019 08:38:51 [facility] ROUTINE RECORD Sinus rhythm Left axis deviation Low voltage QRS Abnormal ECG Confirmed by Artemisa Bile [phone]) on 09/25/2019 6:22:44 PM   Neuro/Psych negative neurological ROS  negative psych ROS   GI/Hepatic Neg liver ROS,GERD  Medicated and Controlled,,  Endo/Other  diabetes, Well Controlled, Type 2, Insulin  Dependent    Renal/GU Renal InsufficiencyRenal disease  negative genitourinary   Musculoskeletal   Abdominal   Peds  Hematology negative hematology ROS (+)   Anesthesia Other Findings   Reproductive/Obstetrics negative OB ROS                             Anesthesia Physical Anesthesia Plan  ASA: 3  Anesthesia Plan: General   Post-op Pain Management: Minimal or no pain anticipated   Induction: Intravenous  PONV Risk Score and Plan: Propofol  infusion  Airway Management Planned: Nasal Cannula and Natural Airway  Additional Equipment: None  Intra-op Plan:   Post-operative Plan:   Informed Consent: I have reviewed the patients History and Physical, chart, labs and discussed the procedure including the risks, benefits  and alternatives for the proposed anesthesia with the patient or authorized representative who has indicated his/her understanding and acceptance.     Dental Advisory Given  Plan Discussed with: CRNA  Anesthesia Plan Comments:         Anesthesia Quick Evaluation

## 2023-07-08 ENCOUNTER — Other Ambulatory Visit: Payer: Self-pay

## 2023-07-08 ENCOUNTER — Ambulatory Visit (HOSPITAL_COMMUNITY)
Admission: RE | Admit: 2023-07-08 | Discharge: 2023-07-08 | Disposition: A | Discharge status: Home / Self Care | Attending: Internal Medicine | Admitting: Internal Medicine

## 2023-07-08 ENCOUNTER — Ambulatory Visit (HOSPITAL_COMMUNITY): Admitting: Anesthesiology

## 2023-07-08 ENCOUNTER — Encounter (HOSPITAL_COMMUNITY)
Admission: RE | Disposition: A | Discharge status: Home / Self Care | Payer: Self-pay | Source: Home / Self Care | Attending: Internal Medicine

## 2023-07-08 ENCOUNTER — Encounter (HOSPITAL_COMMUNITY): Payer: Self-pay | Admitting: Internal Medicine

## 2023-07-08 DIAGNOSIS — Z79899 Other long term (current) drug therapy: Secondary | ICD-10-CM | POA: Diagnosis not present

## 2023-07-08 DIAGNOSIS — Z794 Long term (current) use of insulin: Secondary | ICD-10-CM

## 2023-07-08 DIAGNOSIS — K219 Gastro-esophageal reflux disease without esophagitis: Secondary | ICD-10-CM | POA: Insufficient documentation

## 2023-07-08 DIAGNOSIS — Z1211 Encounter for screening for malignant neoplasm of colon: Secondary | ICD-10-CM

## 2023-07-08 DIAGNOSIS — E119 Type 2 diabetes mellitus without complications: Secondary | ICD-10-CM | POA: Insufficient documentation

## 2023-07-08 DIAGNOSIS — Z860100 Personal history of colon polyps, unspecified: Secondary | ICD-10-CM | POA: Diagnosis present

## 2023-07-08 DIAGNOSIS — K635 Polyp of colon: Secondary | ICD-10-CM

## 2023-07-08 DIAGNOSIS — Z7984 Long term (current) use of oral hypoglycemic drugs: Secondary | ICD-10-CM | POA: Insufficient documentation

## 2023-07-08 DIAGNOSIS — D120 Benign neoplasm of cecum: Secondary | ICD-10-CM | POA: Insufficient documentation

## 2023-07-08 DIAGNOSIS — N289 Disorder of kidney and ureter, unspecified: Secondary | ICD-10-CM | POA: Diagnosis not present

## 2023-07-08 DIAGNOSIS — K621 Rectal polyp: Secondary | ICD-10-CM | POA: Diagnosis not present

## 2023-07-08 DIAGNOSIS — Z8249 Family history of ischemic heart disease and other diseases of the circulatory system: Secondary | ICD-10-CM | POA: Insufficient documentation

## 2023-07-08 DIAGNOSIS — K5140 Inflammatory polyps of colon without complications: Secondary | ICD-10-CM | POA: Insufficient documentation

## 2023-07-08 DIAGNOSIS — I10 Essential (primary) hypertension: Secondary | ICD-10-CM | POA: Diagnosis not present

## 2023-07-08 DIAGNOSIS — Z7985 Long-term (current) use of injectable non-insulin antidiabetic drugs: Secondary | ICD-10-CM | POA: Insufficient documentation

## 2023-07-08 HISTORY — PX: COLONOSCOPY: SHX5424

## 2023-07-08 LAB — GLUCOSE, CAPILLARY: Glucose-Capillary: 122 mg/dL — ABNORMAL HIGH (ref 70–99)

## 2023-07-08 SURGERY — COLONOSCOPY
Anesthesia: General

## 2023-07-08 MED ORDER — PROPOFOL 10 MG/ML IV BOLUS
INTRAVENOUS | Status: DC | PRN
  Administered 2023-07-08: 100 mg via INTRAVENOUS

## 2023-07-08 MED ORDER — LACTATED RINGERS IV SOLN: INTRAVENOUS | Status: DC

## 2023-07-08 MED ORDER — LIDOCAINE 2% (20 MG/ML) 5 ML SYRINGE
INTRAMUSCULAR | Status: DC | PRN
  Administered 2023-07-08: 50 mg via INTRAVENOUS

## 2023-07-08 MED ORDER — PROPOFOL 500 MG/50ML IV EMUL
INTRAVENOUS | Status: DC | PRN
  Administered 2023-07-08: 150 ug/kg/min via INTRAVENOUS

## 2023-07-08 NOTE — Discharge Instructions (Addendum)
  Colonoscopy Discharge Instructions  Read the instructions outlined below and refer to this sheet in the next few weeks. These discharge instructions provide you with general information on caring for yourself after you leave the hospital. Your doctor may also give you specific instructions. While your treatment has been planned according to the most current medical practices available, unavoidable complications occasionally occur. If you have any problems or questions after discharge, call Dr. Riley Cheadle at [phone]. ACTIVITY You may resume your regular activity, but move at a slower pace for the next 24 hours.  Take frequent rest periods for the next 24 hours.  Walking will help get rid of the air and reduce the bloated feeling in your belly (abdomen).  No driving for 24 hours (because of the medicine (anesthesia) used during the test).   Do not sign any important legal documents or operate any machinery for 24 hours (because of the anesthesia used during the test).  NUTRITION Drink plenty of fluids.  You may resume your normal diet as instructed by your doctor.  Begin with a light meal and progress to your normal diet. Heavy or fried foods are harder to digest and may make you feel sick to your stomach (nauseated).  Avoid alcoholic beverages for 24 hours or as instructed.  MEDICATIONS You may resume your normal medications unless your doctor tells you otherwise.  WHAT YOU CAN EXPECT TODAY Some feelings of bloating in the abdomen.  Passage of more gas than usual.  Spotting of blood in your stool or on the toilet paper.  IF YOU HAD POLYPS REMOVED DURING THE COLONOSCOPY: No aspirin products for 7 days or as instructed.  No alcohol for 7 days or as instructed.  Eat a soft diet for the next 24 hours.  FINDING OUT THE RESULTS OF YOUR TEST Not all test results are available during your visit. If your test results are not back during the visit, make an appointment with your caregiver to find out the  results. Do not assume everything is normal if you have not heard from your caregiver or the medical facility. It is important for you to follow up on all of your test results.  SEEK IMMEDIATE MEDICAL ATTENTION IF: You have more than a spotting of blood in your stool.  Your belly is swollen (abdominal distention).  You are nauseated or vomiting.  You have a temperature over 101.  You have abdominal pain or discomfort that is severe or gets worse throughout the day.     5 polyps all relatively small removed from your colon today  Further recommendations to follow pending review of pathology report

## 2023-07-08 NOTE — H&P (Signed)
 @LOGO @   Primary Care Physician:  Artemisa Bile, MD Primary Gastroenterologist:  Dr. Riley Cheadle  Pre-Procedure History & Physical: HPI:  Benjamin Moreno is a 68 y.o. male here for a surveillance colonoscopy history of 8 polyps adenomas removed 2021.  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  8 sessile polyps removed from the rectum and ascending colon measuring 3 to 8 mm in size.  Distal 10 cm of TI appeared normal.  Stool sample submitted for GI pathogen panel and C. difficile both negative.  Multiple tubular adenomas removed.  Next colonoscopy in 3 years.   ESOPHAGOGASTRODUODENOSCOPY (EGD) WITH PROPOFOL  N/A 09/26/2019   Dr. Riley Cheadle: Mucosal changes in the esophagus consistent with reflux on biopsy, esophagus dilated for history of dysphagia.  Gastric antral nodularity with reactive gastritis.  No H. pylori.   MALONEY DILATION  09/26/2019   Procedure: MALONEY DILATION;  Surgeon: Suzette Espy, MD;  Location: [facility];  Service: Endoscopy;;   POLYPECTOMY  09/26/2019   Procedure: POLYPECTOMY;  Surgeon: Suzette Espy, MD;  Location: [facility];  Service: Endoscopy;;    Prior to Admission medications   Medication Sig Start Date End Date Taking? Authorizing Provider  amLODipine  (NORVASC ) 10 MG tablet Take 1 tablet (10 mg total) by mouth daily. 07/27/19  Yes Emokpae, Courage, MD  glimepiride (AMARYL) 2 MG tablet Take 2 mg by mouth every morning.  07/07/19  Yes [provider]  LANTUS SOLOSTAR 100 UNIT/ML Solostar Pen Inject into the skin. 60 units at bedtime 06/27/19  Yes [provider]  metFORMIN (GLUCOPHAGE) 500 MG tablet Take 500 mg by mouth daily with breakfast.   Yes [provider]  ramipril  (ALTACE ) 10 MG capsule Take 1 capsule (10 mg total) by mouth daily. 07/27/19  Yes Emokpae, Courage, MD  tirzepatide [facility]) 10 MG/0.5ML Pen Inject 10 mg into the skin once a week.    [provider]    Allergies as of 06/04/2023   (No Known Allergies)    Family History  Problem Relation Age of Onset   Cancer Mother    Stomach cancer Mother    Heart disease Father    Bowel Disease Paternal Grandmother        Patient isn't sure the etiology. Required colostomy.    Stomach cancer Maternal Uncle        several uncles with stomach cancer   Colon cancer Neg Hx    Inflammatory bowel disease Neg Hx     Social History   Socioeconomic History   Marital status: Married    Spouse name: Not on file   Number of children: Not on file   Years of education: Not on file   Highest education level: Not on file  Occupational History   Not on file  Tobacco Use   Smoking status: Never   Smokeless tobacco: Never  Vaping Use   Vaping status: Never Used  Substance and Sexual Activity   Alcohol use: No   Drug use: No   Sexual activity: Yes  Other Topics Concern   Not on file  Social History Narrative   Not on file   Social Drivers of Health   Financial Resource Strain: Not on file  Food Insecurity: Not on file  Transportation Needs: Not on file  Physical Activity: Not on file  Stress: Not on file  Social Connections: Not on file  Intimate Partner Violence: Not on file    Review of Systems: See HPI, otherwise negative ROS  Physical Exam: BP (!) 153/87   Pulse 65   Temp 98.5 F (36.9 C) (Oral)   Resp 19   Ht 5' 4.5 (1.638 m)   Wt 93 kg   SpO2 96%   BMI 34.64 kg/m  General:   Alert,   Well-developed, well-nourished, pleasant and cooperative in NAD Neck:  Supple; no masses or thyromegaly. No significant cervical adenopathy. Lungs:  Clear throughout to auscultation.   No wheezes, crackles, or rhonchi. No acute distress. Heart:  Regular rate and rhythm; no murmurs, clicks, rubs,  or gallops. Abdomen: Non-distended, normal bowel sounds.  Soft and nontender without appreciable mass or hepatosplenomegaly.  Impression/Plan: 68 year old gentleman multiple adenomas removed previously here for surveillance colonoscopy. The risks, benefits, limitations, alternatives and imponderables have been reviewed with the patient. Questions have been answered. All parties are agreeable.      Notice: This dictation was prepared with Dragon dictation along with smaller phrase technology. Any transcriptional errors that result from this process are unintentional and may not be corrected upon review.

## 2023-07-08 NOTE — Anesthesia Postprocedure Evaluation (Signed)
 Anesthesia Post Note  Patient: Benjamin Moreno  Procedure(s) Performed: COLONOSCOPY  Patient location during evaluation: Endoscopy Anesthesia Type: General Level of consciousness: awake and alert Pain management: pain level controlled Vital Signs Assessment: post-procedure vital signs reviewed and stable Respiratory status: spontaneous breathing Cardiovascular status: stable Postop Assessment: no apparent nausea or vomiting Anesthetic complications: no   No notable events documented.   Last Vitals:  Vitals:   07/08/23 0811 07/08/23 0813  BP: (!) 97/56 97/67  Pulse: 68   Resp: 19   Temp: 36.5 C   SpO2: 95%     Last Pain:  Vitals:   07/08/23 0811  TempSrc: Oral  PainSc: 0-No pain                 Kori Colin

## 2023-07-08 NOTE — Op Note (Signed)
 [facility] Patient Name: Benjamin Moreno Procedure Date: 07/08/2023 7:24 AM MRN: [id number] Date of Birth: 03-Nov-1955 Attending MD: Gemma Kelp , MD, [id number] CSN: [id number] Age: 68 Admit Type: Outpatient Procedure:                Colonoscopy Indications:              High risk colon cancer surveillance: Personal                            history of colonic polyps Providers:                Gemma Kelp, MD, Troy Furnish. Hazeline Lister RN, RN,                            Annell Barrow Referring MD:              Medicines:                Propofol  per Anesthesia Complications:            No immediate complications. Estimated Blood Loss:     Estimated blood loss was minimal. Procedure:                Pre-Anesthesia Assessment:                           - Prior to the procedure, a History and Physical                            was performed, and patient medications and                            allergies were reviewed. The patient's tolerance of                            previous anesthesia was also reviewed. The risks                            and benefits of the procedure and the sedation                            options and risks were discussed with the patient.                            All questions were answered, and informed consent                            was obtained. Prior Anticoagulants: The patient has                            taken no anticoagulant or antiplatelet agents. ASA                            Grade Assessment: III - A patient with severe  systemic disease. After reviewing the risks and                            benefits, the patient was deemed in satisfactory                            condition to undergo the procedure.                           After obtaining informed consent, the colonoscope                            was passed under direct vision. Throughout the                            procedure, the  patient's blood pressure, pulse, and                            oxygen saturations were monitored continuously. The                            [id number]) scope was introduced through the                            anus and advanced to the the cecum, identified by                            appendiceal orifice and ileocecal valve. The                            colonoscopy was performed without difficulty. The                            patient tolerated the procedure well. The quality                            of the bowel preparation was adequate. The                            ileocecal valve, appendiceal orifice, and rectum                            were photographed. The entire colon was well                            visualized. The patient tolerated the procedure                            well. Scope In: 7:42:17 AM Scope Out: 8:05:50 AM Scope Withdrawal Time: 0 hours 17 minutes 16 seconds  Total Procedure Duration: 0 hours 23 minutes 33 seconds  Findings:      Perianal scar present. No evidence of residual condyloma. Otherwise DRE       negative.      Three semi-pedunculated polyps were found in  the mid rectum. The polyps       were 2 to 9 mm in size. These polyps were removed with a cold snare.       Resection and retrieval were complete. Estimated blood loss was minimal.      Two sessile polyps were found in the cecum. The polyps were 2 to 4 mm in       size. These polyps were removed with a cold snare. Resection and       retrieval were complete. Estimated blood loss was minimal.      The exam was otherwise without abnormality on direct and retroflexion       views. Please note the 9 mm polyp in the rectum was to be hot snared but       it was inadvertently cold snared with some bleeding. A resolution clip       placed with cessation of minor bleeding. Impression:               - Three 2 to 9 mm polyps in the mid rectum, removed                            with a  cold snare. Resected and retrieved. Clip x 1                           - Two 2 to 4 mm polyps in the cecum, removed with a                            cold snare. Resected and retrieved.                           - The examination was otherwise normal on direct                            and retroflexion views. Moderate Sedation:      Moderate (conscious) sedation was personally administered by an       anesthesia professional. The following parameters were monitored: oxygen       saturation, heart rate, blood pressure, respiratory rate, EKG, adequacy       of pulmonary ventilation, and response to care. Recommendation:           - Patient has a contact number available for                            emergencies. The signs and symptoms of potential                            delayed complications were discussed with the                            patient. Return to normal activities tomorrow.                            Written discharge instructions were provided to the                            patient.                           -  Advance diet as tolerated.                           - Continue present medications.                           - Repeat colonoscopy date to be determined after                            pending pathology results are reviewed for                            surveillance.                           - Return to GI office (date not yet determined). 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 All rights  reserved. The codes documented in this report are preliminary and upon coder review may  be revised to meet current compliance requirements. Windsor Hatcher. Masayo Fera, MD Gemma Kelp, MD 07/08/2023 8:20:33 AM This report has been signed electronically. Number of Addenda: 0

## 2023-07-08 NOTE — Transfer of Care (Addendum)
 Immediate Anesthesia Transfer of Care Note  Patient: Benjamin Moreno  Procedure(s) Performed: COLONOSCOPY  Patient Location: PACU and Endoscopy Unit  Anesthesia Type:General  Level of Consciousness: awake  Airway & Oxygen Therapy: Patient Spontanous Breathing  Post-op Assessment: Report given to RN  Post vital signs: Reviewed  Last Vitals:  Vitals Value Taken Time  BP 97/56   Temp 36.5   Pulse 68   Resp 16   SpO2 95     Last Pain:  Vitals:   07/08/23 0740  TempSrc:   PainSc: 0-No pain      Patients Stated Pain Goal: 10 (07/08/23 0641)  Complications: No notable events documented.

## 2023-07-09 ENCOUNTER — Encounter (HOSPITAL_COMMUNITY): Payer: Self-pay | Admitting: Internal Medicine

## 2023-07-09 LAB — SURGICAL PATHOLOGY

## 2023-07-20 ENCOUNTER — Ambulatory Visit: Payer: Self-pay | Admitting: Internal Medicine
